# Patient Record
Sex: Female | Born: 1935 | Race: White | Hispanic: No | Smoking: Never smoker
Health system: Southern US, Community
[De-identification: ages and names within clinical notes are randomized; demographics above are authoritative.]

## PROBLEM LIST (undated history)

## (undated) DIAGNOSIS — N393 Stress incontinence (female) (male): Secondary | ICD-10-CM

## (undated) DIAGNOSIS — F329 Major depressive disorder, single episode, unspecified: Secondary | ICD-10-CM

## (undated) DIAGNOSIS — K219 Gastro-esophageal reflux disease without esophagitis: Secondary | ICD-10-CM

## (undated) DIAGNOSIS — D649 Anemia, unspecified: Secondary | ICD-10-CM

## (undated) DIAGNOSIS — E785 Hyperlipidemia, unspecified: Secondary | ICD-10-CM

## (undated) DIAGNOSIS — F32A Depression, unspecified: Secondary | ICD-10-CM

## (undated) DIAGNOSIS — M1990 Unspecified osteoarthritis, unspecified site: Secondary | ICD-10-CM

## (undated) HISTORY — PX: CHOLECYSTECTOMY: SHX55

## (undated) HISTORY — PX: OTHER SURGICAL HISTORY: SHX169

---

## 1999-09-06 ENCOUNTER — Encounter: Payer: Self-pay | Admitting: Internal Medicine

## 1999-09-06 ENCOUNTER — Ambulatory Visit (HOSPITAL_COMMUNITY): Admission: RE | Admit: 1999-09-06 | Discharge: 1999-09-06 | Payer: Self-pay | Admitting: Internal Medicine

## 2000-01-11 ENCOUNTER — Encounter: Payer: Self-pay | Admitting: Urology

## 2000-01-15 ENCOUNTER — Observation Stay (HOSPITAL_COMMUNITY): Admission: RE | Admit: 2000-01-15 | Discharge: 2000-01-16 | Payer: Self-pay | Admitting: Urology

## 2001-03-04 ENCOUNTER — Ambulatory Visit (HOSPITAL_COMMUNITY): Admission: RE | Admit: 2001-03-04 | Discharge: 2001-03-04 | Payer: Self-pay | Admitting: Internal Medicine

## 2001-03-04 ENCOUNTER — Encounter: Payer: Self-pay | Admitting: Internal Medicine

## 2002-02-02 ENCOUNTER — Other Ambulatory Visit: Admission: RE | Admit: 2002-02-02 | Discharge: 2002-02-02 | Payer: Self-pay | Admitting: Internal Medicine

## 2004-02-04 ENCOUNTER — Other Ambulatory Visit: Admission: RE | Admit: 2004-02-04 | Discharge: 2004-02-04 | Payer: Self-pay | Admitting: Internal Medicine

## 2004-02-28 ENCOUNTER — Encounter: Admission: RE | Admit: 2004-02-28 | Discharge: 2004-02-28 | Payer: Self-pay | Admitting: Internal Medicine

## 2004-04-27 ENCOUNTER — Ambulatory Visit (HOSPITAL_COMMUNITY): Admission: RE | Admit: 2004-04-27 | Discharge: 2004-04-27 | Payer: Self-pay | Admitting: Gastroenterology

## 2004-05-01 ENCOUNTER — Ambulatory Visit (HOSPITAL_COMMUNITY): Admission: RE | Admit: 2004-05-01 | Discharge: 2004-05-01 | Payer: Self-pay | Admitting: Cardiology

## 2004-05-16 ENCOUNTER — Ambulatory Visit (HOSPITAL_COMMUNITY): Admission: RE | Admit: 2004-05-16 | Discharge: 2004-05-16 | Payer: Self-pay | Admitting: Cardiology

## 2006-02-13 ENCOUNTER — Other Ambulatory Visit: Admission: RE | Admit: 2006-02-13 | Discharge: 2006-02-13 | Payer: Self-pay | Admitting: Internal Medicine

## 2007-02-08 ENCOUNTER — Inpatient Hospital Stay (HOSPITAL_COMMUNITY): Admission: EM | Admit: 2007-02-08 | Discharge: 2007-02-10 | Payer: Self-pay | Admitting: Emergency Medicine

## 2008-11-15 ENCOUNTER — Other Ambulatory Visit: Admission: RE | Admit: 2008-11-15 | Discharge: 2008-11-15 | Payer: Self-pay | Admitting: Geriatric Medicine

## 2010-04-26 ENCOUNTER — Encounter: Admission: RE | Admit: 2010-04-26 | Discharge: 2010-04-26 | Payer: Self-pay | Admitting: Geriatric Medicine

## 2011-01-30 NOTE — Discharge Summary (Signed)
Wong, Chloe              ACCOUNT NO.:  [id number]   MEDICAL RECORD NO.:  [id number]          PATIENT TYPE:  INP   LOCATION:  5038                         FACILITY:  [facility]   PHYSICIAN:  Kela Millin, M.D.DATE OF BIRTH:  24-Nov-1930   DATE OF ADMISSION:  02/08/2007  DATE OF DISCHARGE:                               DISCHARGE SUMMARY   DICTATION CANCELED      Kela Millin, M.D.     ACV/MEDQ  D:  02/10/2007  T:  02/10/2007  Job:  [id number]

## 2011-01-30 NOTE — H&P (Signed)
NAMEHARRYETTE, Wong              ACCOUNT NO.:  [id number]   MEDICAL RECORD NO.:  [id number]          PATIENT TYPE:  EMS   LOCATION:  MAJO                         FACILITY:  [facility]   PHYSICIAN:  Corinna L. Lendell Caprice, MDDATE OF BIRTH:  07/09/1931   DATE OF ADMISSION:  02/08/2007  DATE OF DISCHARGE:                              HISTORY & PHYSICAL   Chloe Wong is a 77 year old white female patient of Dr. Pete Glatter,  who presents with a 3-day history of vomiting.  She has been weak and  dizzy.  She lives alone.  She denies any dysuria.  She has chronic  urinary frequency and has to wear Depends.  Apparently an antiemetic was  called in for her but she continues to vomit.  She had been treated for  a sinus infection a week or two ago but this has improved.  She has no  diarrhea, no sick contacts, no recent travel, no abdominal pain.   PAST MEDICAL HISTORY:  1. Gastroesophageal reflux disease.  2. Anxiety.  3. Hyperlipidemia.   MEDICATIONS:  Citalopram, Prilosec.   No known drug allergies.   SOCIAL HISTORY:  The patient does not drink or smoke.   FAMILY HISTORY:  Noncontributory.   PAST SURGICAL HISTORY:  Cholecystectomy.   REVIEW OF SYSTEMS:  As above, otherwise negative.   PHYSICAL EXAMINATION:  VITAL SIGNS:  Temperature is 99.2, blood pressure  103/64, pulse 89, respiratory rate 20, oxygen saturation 95% on room  air.  GENERAL:  The patient is well-nourished, well-developed, in no acute  distress.  HEENT:  Normocephalic, atraumatic.  Pupils equal, round, and reactive to  light.  Sclerae are nonicteric.  Her mucous membranes are slightly dry.  NECK:  Supple.  No carotid bruits.  No thyromegaly.  LUNGS:  Clear to auscultation bilaterally without wheezes, rhonchi or  rales.  CARDIOVASCULAR:  Regular rate and rhythm without murmur, gallop, or rub.  ABDOMEN:  Normal bowel sounds, soft, nontender, nondistended.  GENITOURINARY, RECTAL:  Deferred.  EXTREMITIES:  No clubbing,  cyanosis, or edema.  SKIN:  No rash.  PSYCHIATRIC:  Normal affect.  NEUROLOGIC:  Alert and oriented.  Cranial nerves and sensorimotor exam  are intact.   LABORATORY DATA:  White blood cell count is 11.5 with 93% neutrophils,  4% lymphocytes, otherwise normal CBC.  Complete metabolic panel  significant for a potassium of 3.1, glucose 119, albumin 3.0, otherwise  unremarkable.  Urinalysis is turbid, rare squamous epithelial cells,  specific gravity 1.014, pH 5.5, small bilirubin, 15 ketones, moderate  blood, 100 protein, 4 urobilinogen, negative nitrite, large leukocyte  esterase, too numerous to count white cells, 3-6 red cells, few  bacteria.  Acute abdominal series to my reading shows a normal bowel gas  pattern and a negative chest x-ray.   ASSESSMENT AND PLAN:  1. Vomiting and dehydration.  The patient will be admitted for IV      fluids and supportive care.  2. Hyponatremia.  This will be repleted IV.  3. Urinary tract infection.  I will give Rocephin.  4. Gastroesophageal reflux disease.  She will get Protonix IV.  5. Anxiety.  She will get p.r.n. Ativan.  6. Hyperlipidemia.      Corinna L. Lendell Caprice, MD  Electronically Signed     CLS/MEDQ  D:  02/08/2007  T:  02/08/2007  Job:  [id number]   cc:   Hal T. Stoneking, M.D.

## 2011-02-02 NOTE — Cardiovascular Report (Signed)
NAME:  Chloe Wong, Chloe Wong                     ACCOUNT NO.:  [id number]   MEDICAL RECORD NO.:  [id number]                   PATIENT TYPE:  OIB   LOCATION:  2866                                 FACILITY:  [facility]   PHYSICIAN:  Armanda Magic, M.D.                  DATE OF BIRTH:  [birth year]   DATE OF PROCEDURE:  05/16/2004  DATE OF DISCHARGE:  05/16/2004                              CARDIAC CATHETERIZATION   REFERRING PHYSICIAN:  Darius Bump, M.D.   PROCEDURE:  Left heart catheterization, coronary angiography, left  ventriculography.   OPERATOR:  Armanda Magic, M.D.   INDICATIONS:  Chest pain, abnormal Cardiolite.   COMPLICATIONS:  None.   IV access via right femoral artery with 6 French sheath.   This is a 77 year old white female with a history of very vague chest pain  in the past and history of hypertension as well as reflux and also some  presyncope after being placed on atenolol.  She underwent stress Cardiolite  study, which showed inducible ischemia in the inferior wall versus  diaphragmatic attenuation.  She now presents for heart catheterization.   The patient was brought to the cardiac catheterization laboratory in the  fasting, nonsedated state.  Informed consent was obtained.  The patient was  connected to continuous heart rate and pulse oximetry monitoring and  intermittent blood pressure monitoring.  The right groin was prepped and  draped in a sterile fashion.  Xylocaine 1% was used for local anesthesia.  Using a modified Seldinger technique, a 6 French sheath was placed in the  right femoral artery.  Under fluoroscopic guidance a 6 [place] JL4 catheter  was placed in the left coronary artery.  Multiple cine films were taken in  30 degree RAO and 40 degree LAO views.  This catheter was exchanged out over  a guidewire for a 6 [place] JR4 catheter, which was placed under fluoroscopic  guidance in the right coronary artery.  Multiple cine films were taken in 30  degree RAO and 40 degree LAO views.  This catheter was then exchanged out  over a guidewire for a 6 French angled pigtail catheter, which was placed  under fluoroscopic guidance into the left ventricular cavity.  Left  ventriculography was performed in the 30 degree RAO view using a total of 30  mL of contrast at 30 cc/sec.  The catheter was then pulled back across the  aortic valve with no significant gradient.  At the end of the procedure all  catheters and sheaths were removed.  Manual compression was performed until  adequate hemostasis was obtained.  The patient was transferred back to her  room in stable condition.   RESULTS:  1.  The left main coronary artery is widely patent and bifurcates into the      left anterior descending and left circumflex artery.  2.  Left anterior descending artery gives rise to a very large diagonal  1,      which bifurcates into two daughter vessels, both of which are widely      patent.  The LAD then gives rise to a second diagonal branch, which is      widely patent.  The LAD is widely patent throughout its course to the      apex.  3.  The left circumflex is widely patent throughout its course and gives      rise to one obtuse marginal branch, which bifurcates into two daughter      branches, all of which are widely patent.  4.  The right coronary artery is widely patent throughout its course and      bifurcates distally into a posterior descending artery and      posterolateral artery, both of which are widely patent.  5.  Left ventriculography shows normal LV systolic function, EF 60%.  6.  Left ventricular pressure 117/6 mmHg, LVEDP 16 mmHg, aortic pressure      124/67 mmHg.   ASSESSMENT:  1.  Noncardiac chest pain.  2.  Normal coronary arteries.  3.  Normal left ventricular function.   PLAN:  Discharge to home after IV fluids and bed rest.  Groin check in two  weeks with me.                                               Armanda Magic,  M.D.    TT/MEDQ  D:  05/16/2004  T:  05/17/2004  Job:  [id number]

## 2011-02-02 NOTE — Op Note (Signed)
[facility]  Patient:    Chloe Wong, Chloe Wong                       MRN: [id number] Proc. Date: 01/15/00 Adm. Date:  [id number] Disc. Date: [id number] Attending:  Thermon Leyland CC:         Darius Bump, M.D.                           Operative Report  PREOPERATIVE DIAGNOSIS: 1. Grade 2 cystourethrocele. 2. Stress urinary incontinence.  POSTOPERATIVE DIAGNOSIS: 1. Grade 2 cystourethrocele. 2. Stress urinary incontinence.  OPERATION PERFORMED:  Anterior repair, pubovaginal sling, flexible cystoscopy and superpubic tube placement.  SURGEON:  Barron Alvine, M.D.  ANESTHESIA:  General.  INDICATIONS FOR PROCEDURE:  Ms. Deming is a 77 year old female.  She has had  longstanding complaints of significant stress incontinence.  She also has some moderate irritative voiding symptoms.  She has some problems with urinary urgency. Her biggest complaint, however, is stress incontinence which occurs with coughing, sneezing, laughing, lifting, etc.  She wears pads on a regular basis and this alters her quality of life.  On exam, she had evidence of urethral hypermobility with a positive Marshalls test and fairly significant objective stress incontinence.  The patient elected to proceed with pubovaginal sling and anterior repair.  She understands the advantages and disadvantages of this approach. She understands the risks potentially of worsening of her irritative voiding symptoms as well as the potential problem for urinary retention or incomplete bladder emptying and occasional need for additional surgery to correct problems.  Full informed consent was obtained.  DESCRIPTION OF PROCEDURE:  The patient was brought to the operating room where he had successful induction of general anesthesia.  She was placed in the midlithotomy position and prepped and draped in the usual manner.  A weighted vaginal speculum was utilized for exposure.  A Foley  catheter was placed and the bladder was drained.  The anterior vaginal mucosa was infiltrated with some Marcaine.  An incisoin was then made from midurethra back torwards the cervix.  The dissection was then performed between the vaginal wall and underlying bladder.  The tissue  showed normal planes of dissection.  The retropubic spaces were entered with fingertip blunt dissective technique and those tissues were found to be very attenuated and poor.  There were no obvious adhesions around the area of the bladder neck.  The entire cystocele was dissected out and freed.  We then performed a standard anterior repair utilizing four separate interrupted mattress sutures and 2-0 Vicryl suture to reapproximate the fascia and reduce the cystocele.  I then  re-established cardinal ligaments in the midline utilizing an additional 2-0 Vicryl suture.  Attention was then turned towards construction of the sling. Cadaveric fascia lata was chosen and a piece measuring approximately 2.5 x 10 cm was picked. This was anchored on both ends with #1 nylon suture.  A small incision was made in the superpubic area which was carried down to the level of the anterior fascia.  With direct digital finger control, a clamp was placed underneath the pubic symphysis and out the vaginal incision on the right side.  The nylon sutures were grabbed and brought out the superpubic incision.  This was done on the contralateral side as well.  The sling was then positioned right at the bladder  neck.  It was pexed in open position with some 3-0 Vicryl  suture.  Cystoscopy was then performed.  The sling appeared to be in good position right at the bladder  neck.  Blue dye could be seen from both orifices.  There was no evidence of bladder injury.  The suprapubic tube was placed with direct visual guidance right at the dome of the bladder.  Some anterior vaginal mucosa which was redundant was trimmed.  The vaginal  incision was then closed with a running 2-0 Vicryl suture.  The sling was purposely tied very loosely over three fingers.  The skin was reapproximated with surgical clips.  Some vaginal packing was applied.  The Foley catheter was  removed and the suprapubic tube was left to straight drainage.  The sponge and needle counts were correct.  Blood loss was minimal.  The patient appeared to tolerate the procedure well and she was brought to the recovery room in stable condition. DD:  01/15/00 TD:  01/17/00 Job: [id number]

## 2011-02-02 NOTE — Op Note (Signed)
[name], [name]                   ACCOUNT NO.:  [id number]   MEDICAL RECORD NO.:  [id number]                   PATIENT TYPE:  AMB   LOCATION:  ENDO                                 FACILITY:  [facility]   PHYSICIAN:  Danise Edge, M.D.                DATE OF BIRTH:  [birth year]   DATE OF PROCEDURE:  04/27/2004  DATE OF DISCHARGE:                                 OPERATIVE REPORT   PROCEDURE:  Screening colonoscopy.   PROCEDURE INDICATION:  Chloe Wong is a 77 year old female,  born 1930-12-08.  [name] is undergoing her first screening  colonoscopy with polypectomy to prevent colon cancer.   ENDOSCOPIST:  Danise Edge, M.D.   PREMEDICATION:  1. Versed 4 mg.  2. Demerol 50 mg.   DESCRIPTION OF PROCEDURE:  After obtaining informed consent, [name]  was placed in the left lateral decubitus position.  I administered  intravenous Demerol and intravenous Versed to achieve conscious sedation for  the procedure.  The patient's blood pressure, oxygen saturation, and cardiac  rhythm were monitored throughout the procedure and documented in the medical  record.   Anal inspection and digital rectal exam was normal.  The Olympus adjustable  pediatric colonoscope was introduced into the rectum and easily advanced to  the cecum.  Colonic preparation for the exam today was excellent.   RECTUM:  Normal.  SIGMOID COLON AND DESCENDING COLON:  Normal.  A few small diverticula are  present.  SPLENIC FLEXURE:  Normal.  TRANSVERSE COLON:  Normal.  HEPATIC FLEXURE:  Normal.  ASCENDING COLON:  Normal.  CECUM AND ILEOCECAL VALVE:  Normal.   ASSESSMENT:  Normal screening proctocolonoscopy to the cecum.  No endoscopic  evidence for the presence of colorectal neoplasia.                                               Danise Edge, M.D.    MJ/MEDQ  D:  04/27/2004  T:  04/27/2004  Job:  [id number]   cc:   Darius Bump, M.D.  [place] [place]  [place] [address]  Fax: [phone]

## 2013-06-11 ENCOUNTER — Other Ambulatory Visit: Payer: Self-pay | Admitting: Gastroenterology

## 2013-06-12 ENCOUNTER — Encounter (HOSPITAL_COMMUNITY): Payer: Self-pay | Admitting: *Deleted

## 2013-06-29 ENCOUNTER — Encounter (HOSPITAL_COMMUNITY): Payer: Self-pay | Admitting: Pharmacy Technician

## 2013-07-07 ENCOUNTER — Ambulatory Visit (HOSPITAL_COMMUNITY)
Admission: RE | Admit: 2013-07-07 | Discharge: 2013-07-07 | Disposition: A | Discharge status: Home / Self Care | Payer: Medicare Other | Source: Ambulatory Visit | Attending: Gastroenterology | Admitting: Gastroenterology

## 2013-07-07 ENCOUNTER — Encounter (HOSPITAL_COMMUNITY)
Admission: RE | Disposition: A | Discharge status: Home / Self Care | Payer: Self-pay | Source: Ambulatory Visit | Attending: Gastroenterology

## 2013-07-07 ENCOUNTER — Ambulatory Visit (HOSPITAL_COMMUNITY): Payer: Medicare Other | Admitting: *Deleted

## 2013-07-07 ENCOUNTER — Encounter (HOSPITAL_COMMUNITY): Payer: Self-pay | Admitting: *Deleted

## 2013-07-07 ENCOUNTER — Encounter (HOSPITAL_COMMUNITY): Payer: Medicare Other | Admitting: *Deleted

## 2013-07-07 DIAGNOSIS — K449 Diaphragmatic hernia without obstruction or gangrene: Secondary | ICD-10-CM | POA: Insufficient documentation

## 2013-07-07 DIAGNOSIS — K5730 Diverticulosis of large intestine without perforation or abscess without bleeding: Secondary | ICD-10-CM | POA: Insufficient documentation

## 2013-07-07 DIAGNOSIS — K219 Gastro-esophageal reflux disease without esophagitis: Secondary | ICD-10-CM | POA: Insufficient documentation

## 2013-07-07 DIAGNOSIS — K222 Esophageal obstruction: Secondary | ICD-10-CM | POA: Insufficient documentation

## 2013-07-07 DIAGNOSIS — E7800 Pure hypercholesterolemia, unspecified: Secondary | ICD-10-CM | POA: Insufficient documentation

## 2013-07-07 DIAGNOSIS — D509 Iron deficiency anemia, unspecified: Secondary | ICD-10-CM | POA: Insufficient documentation

## 2013-07-07 HISTORY — DX: Unspecified osteoarthritis, unspecified site: M19.90

## 2013-07-07 HISTORY — PX: ESOPHAGOGASTRODUODENOSCOPY (EGD) WITH PROPOFOL: SHX5813

## 2013-07-07 HISTORY — PX: COLONOSCOPY WITH PROPOFOL: SHX5780

## 2013-07-07 HISTORY — DX: Gastro-esophageal reflux disease without esophagitis: K21.9

## 2013-07-07 HISTORY — DX: Anemia, unspecified: D64.9

## 2013-07-07 HISTORY — DX: Stress incontinence (female) (male): N39.3

## 2013-07-07 HISTORY — DX: Hyperlipidemia, unspecified: E78.5

## 2013-07-07 SURGERY — COLONOSCOPY WITH PROPOFOL
Anesthesia: Monitor Anesthesia Care

## 2013-07-07 SURGERY — ESOPHAGOGASTRODUODENOSCOPY (EGD) WITH PROPOFOL
Anesthesia: Monitor Anesthesia Care

## 2013-07-07 MED ORDER — LACTATED RINGERS IV SOLN
INTRAVENOUS | Status: DC
  Administered 2013-07-07: 1000 mL via INTRAVENOUS

## 2013-07-07 MED ORDER — PROPOFOL 10 MG/ML IV BOLUS
INTRAVENOUS | Status: DC | PRN
  Administered 2013-07-07: 30 mg via INTRAVENOUS

## 2013-07-07 MED ORDER — KETAMINE HCL 10 MG/ML IJ SOLN
INTRAMUSCULAR | Status: DC | PRN
  Administered 2013-07-07: 25 mg via INTRAVENOUS

## 2013-07-07 MED ORDER — LABETALOL HCL 5 MG/ML IV SOLN
INTRAVENOUS | Status: DC | PRN
  Administered 2013-07-07 (×3): 5 mg via INTRAVENOUS

## 2013-07-07 MED ORDER — ONDANSETRON HCL 4 MG/2ML IJ SOLN
INTRAMUSCULAR | Status: DC | PRN
  Administered 2013-07-07: 4 mg via INTRAVENOUS

## 2013-07-07 MED ORDER — PROPOFOL INFUSION 10 MG/ML OPTIME
INTRAVENOUS | Status: DC | PRN
  Administered 2013-07-07: 100 ug/kg/min via INTRAVENOUS

## 2013-07-07 MED ORDER — MIDAZOLAM HCL 5 MG/5ML IJ SOLN
INTRAMUSCULAR | Status: DC | PRN
  Administered 2013-07-07 (×2): 1 mg via INTRAVENOUS

## 2013-07-07 SURGICAL SUPPLY — 25 items

## 2013-07-07 NOTE — Anesthesia Postprocedure Evaluation (Signed)
  Anesthesia Post-op Note  Patient: EDDYE BROXTERMAN Radde  Procedure(s) Performed: Procedure(s) (LRB): COLONOSCOPY WITH PROPOFOL (N/A) ESOPHAGOGASTRODUODENOSCOPY (EGD) WITH PROPOFOL (N/A)  Patient Location: PACU  Anesthesia Type: MAC  Level of Consciousness: awake and alert   Airway and Oxygen Therapy: Patient Spontanous Breathing  Post-op Pain: mild  Post-op Assessment: Post-op Vital signs reviewed, Patient's Cardiovascular Status Stable, Respiratory Function Stable, Patent Airway and No signs of Nausea or vomiting  Last Vitals:  Filed Vitals:   07/07/13 1420  BP: 129/61  Temp:   Resp: 13    Post-op Vital Signs: stable   Complications: No apparent anesthesia complications

## 2013-07-07 NOTE — H&P (Signed)
  Problem: Iron deficiency based on a low serum iron saturation in February 2013 and August 2014. Normal screening colonoscopy in August 2012.  History: The patient is an 79 year old female born May 31, 1931. The patient has unexplained iron deficiency. She denies hemoptysis, hematuria, or gastrointestinal bleeding. She does not take nonsteroidal anti-inflammatory medication. She has been experiencing epigastric discomfort with postprandial regurgitation.  The patient is scheduled to undergo diagnostic esophagogastroduodenoscopy and colonoscopy.  Past medical history: Hypercholesterolemia. Rosacea. Gastroesophageal reflux. Allergic rhinitis. Urinary incontinence. Osteopenia. Colonic diverticulosis. Benign familial tremor. Cholecystectomy. Bladder suspension surgery. Bilateral cataract surgery.  Medication allergies: Lipitor and simvastatin.  Exam: The patient is alert and lying comfortably on the endoscopy stretcher. Abdomen is soft and nontender to palpation. Cardiac exam reveals a regular rhythm. Lungs are clear to auscultation.  Plan . Proceed with diagnostic esophagogastroduodenoscopy and colonoscopy to evaluate unexplained iron deficiency.

## 2013-07-07 NOTE — Anesthesia Preprocedure Evaluation (Signed)
Anesthesia Evaluation  Patient identified by MRN, date of birth, ID band Patient awake    Reviewed: Allergy & Precautions, H&P , NPO status , Patient's Chart, lab work & pertinent test results  Airway Mallampati: II TM Distance: >3 FB Neck ROM: Full    Dental no notable dental hx.    Pulmonary neg pulmonary ROS,  breath sounds clear to auscultation  Pulmonary exam normal       Cardiovascular negative cardio ROS  Rhythm:Regular Rate:Normal     Neuro/Psych negative neurological ROS  negative psych ROS   GI/Hepatic negative GI ROS, Neg liver ROS,   Endo/Other  negative endocrine ROS  Renal/GU negative Renal ROS  negative genitourinary   Musculoskeletal negative musculoskeletal ROS (+)   Abdominal   Peds negative pediatric ROS (+)  Hematology negative hematology ROS (+)   Anesthesia Other Findings   Reproductive/Obstetrics negative OB ROS                           Anesthesia Physical Anesthesia Plan  ASA: II  Anesthesia Plan: MAC   Post-op Pain Management:    Induction:   Airway Management Planned:   Additional Equipment:   Intra-op Plan:   Post-operative Plan:   Informed Consent: I have reviewed the patients History and Physical, chart, labs and discussed the procedure including the risks, benefits and alternatives for the proposed anesthesia with the patient or authorized representative who has indicated his/her understanding and acceptance.   Dental advisory given  Plan Discussed with:   Anesthesia Plan Comments:         Anesthesia Quick Evaluation

## 2013-07-07 NOTE — Preoperative (Signed)
Beta Blockers   Reason not to administer Beta Blockers:Not Applicable, not on home BB 

## 2013-07-07 NOTE — Transfer of Care (Signed)
Immediate Anesthesia Transfer of Care Note  Patient: Chloe Wong  Procedure(s) Performed: Procedure(s): COLONOSCOPY WITH PROPOFOL (N/A) ESOPHAGOGASTRODUODENOSCOPY (EGD) WITH PROPOFOL (N/A)  Patient Location: PACU  Anesthesia Type:MAC  Level of Consciousness: Patient easily awoken, sedated, comfortable, cooperative, following commands, responds to stimulation.   Airway & Oxygen Therapy: Patient spontaneously breathing, ventilating well, oxygen via simple oxygen mask.  Post-op Assessment: Report given to PACU RN, vital signs reviewed and stable, moving all extremities.   Post vital signs: Reviewed and stable.  Complications: No apparent anesthesia complications

## 2013-07-07 NOTE — Op Note (Signed)
Problem: Unexplained iron deficiency  Endoscopist: Danise Edge  Premedication: Propofol administered by anesthesia  Procedure: Diagnostic esophagogastroduodenoscopy The patient was placed in the left lateral decubitus position. The Pentax gastroscope was passed through the posterior hypopharynx into the proximal esophagus without difficulty. The hypopharynx, larynx, and vocal cords appeared normal.  Esophagoscopy: The proximal, mid, and lower segments of the esophageal mucosa appear normal. The squamocolumnar junction is noted at 30 cm from the incisor teeth. There is a nonobstructing Schatzki's ring at the esophagogastric junction.  Gastroscopy: There is a large hiatal hernia. Retroflexed view of the gastric cardia and fundus is normal. The gastric body, antrum, and pylorus appeared normal.  Duodenoscopy: The duodenal bulb and descending duodenum appeared normal.  Assessment: Nonobstructing Schatzki's ring at the esophagogastric junction. Large hiatal hernia. Otherwise normal esophagogastroduodenoscopy  Procedure: Diagnostic colonoscopy Anal inspection and digital rectal exam were normal. The Pentax pediatric colonoscope was introduced into the rectum and advanced to the cecum. A normal-appearing ileocecal valve and appendiceal orifice were identified. Colonic preparation for the exam today was good.  Rectum. Normal. Retroflex view of the distal rectum normal.  Sigmoid colon and descending colon. Left colonic diverticulosis  Splenic flexure. Normal  Transverse colon. Normal  Hepatic flexure. Normal  Ascending colon. Normal  Cecum and ileocecal valve. Normal  Assessment: Normal diagnostic proctocolonoscopy to the cecum. Left colonic diverticulosis.

## 2013-07-08 ENCOUNTER — Encounter (HOSPITAL_COMMUNITY): Payer: Self-pay | Admitting: Gastroenterology

## 2013-10-14 ENCOUNTER — Inpatient Hospital Stay (HOSPITAL_COMMUNITY)
Admission: EM | Admit: 2013-10-14 | Discharge: 2013-10-16 | DRG: 689 | Disposition: A | Discharge status: Home Health | Payer: Medicare Other | Attending: Internal Medicine | Admitting: Internal Medicine

## 2013-10-14 ENCOUNTER — Encounter (HOSPITAL_COMMUNITY): Payer: Self-pay | Admitting: Emergency Medicine

## 2013-10-14 ENCOUNTER — Emergency Department (HOSPITAL_COMMUNITY): Payer: Medicare Other

## 2013-10-14 DIAGNOSIS — R4701 Aphasia: Secondary | ICD-10-CM | POA: Diagnosis present

## 2013-10-14 DIAGNOSIS — R4182 Altered mental status, unspecified: Secondary | ICD-10-CM

## 2013-10-14 DIAGNOSIS — M129 Arthropathy, unspecified: Secondary | ICD-10-CM | POA: Diagnosis present

## 2013-10-14 DIAGNOSIS — E785 Hyperlipidemia, unspecified: Secondary | ICD-10-CM | POA: Diagnosis present

## 2013-10-14 DIAGNOSIS — N390 Urinary tract infection, site not specified: Principal | ICD-10-CM

## 2013-10-14 DIAGNOSIS — G9340 Encephalopathy, unspecified: Secondary | ICD-10-CM

## 2013-10-14 DIAGNOSIS — K219 Gastro-esophageal reflux disease without esophagitis: Secondary | ICD-10-CM | POA: Diagnosis present

## 2013-10-14 DIAGNOSIS — I10 Essential (primary) hypertension: Secondary | ICD-10-CM

## 2013-10-14 DIAGNOSIS — Z87440 Personal history of urinary (tract) infections: Secondary | ICD-10-CM

## 2013-10-14 HISTORY — DX: Major depressive disorder, single episode, unspecified: F32.9

## 2013-10-14 HISTORY — DX: Depression, unspecified: F32.A

## 2013-10-14 LAB — CBC
HEMATOCRIT: 37.2 % (ref 36.0–46.0)
Hemoglobin: 12.8 g/dL (ref 12.0–15.0)
MCH: 31.2 pg (ref 26.0–34.0)
MCHC: 34.4 g/dL (ref 30.0–36.0)
MCV: 90.7 fL (ref 78.0–100.0)
PLATELETS: 216 10*3/uL (ref 150–400)
RBC: 4.1 MIL/uL (ref 3.87–5.11)
RDW: 17.4 % — ABNORMAL HIGH (ref 11.5–15.5)
WBC: 11.2 10*3/uL — ABNORMAL HIGH (ref 4.0–10.5)

## 2013-10-14 LAB — CBC WITH DIFFERENTIAL/PLATELET
BASOS PCT: 0 % (ref 0–1)
Basophils Absolute: 0 10*3/uL (ref 0.0–0.1)
Eosinophils Absolute: 0.1 10*3/uL (ref 0.0–0.7)
Eosinophils Relative: 1 % (ref 0–5)
HEMATOCRIT: 40.4 % (ref 36.0–46.0)
Hemoglobin: 13.7 g/dL (ref 12.0–15.0)
LYMPHS PCT: 29 % (ref 12–46)
Lymphs Abs: 2.7 10*3/uL (ref 0.7–4.0)
MCH: 30.9 pg (ref 26.0–34.0)
MCHC: 33.9 g/dL (ref 30.0–36.0)
MCV: 91 fL (ref 78.0–100.0)
Monocytes Absolute: 0.7 10*3/uL (ref 0.1–1.0)
Monocytes Relative: 8 % (ref 3–12)
NEUTROS ABS: 5.8 10*3/uL (ref 1.7–7.7)
NEUTROS PCT: 62 % (ref 43–77)
Platelets: 258 10*3/uL (ref 150–400)
RBC: 4.44 MIL/uL (ref 3.87–5.11)
RDW: 17.4 % — AB (ref 11.5–15.5)
WBC: 9.3 10*3/uL (ref 4.0–10.5)

## 2013-10-14 LAB — COMPREHENSIVE METABOLIC PANEL
ALBUMIN: 3.6 g/dL (ref 3.5–5.2)
ALK PHOS: 62 U/L (ref 39–117)
ALT: 10 U/L (ref 0–35)
AST: 21 U/L (ref 0–37)
BILIRUBIN TOTAL: 0.3 mg/dL (ref 0.3–1.2)
BUN: 16 mg/dL (ref 6–23)
CO2: 21 meq/L (ref 19–32)
Calcium: 9.7 mg/dL (ref 8.4–10.5)
Chloride: 100 mEq/L (ref 96–112)
Creatinine, Ser: 1.01 mg/dL (ref 0.50–1.10)
GFR calc Af Amer: 59 mL/min — ABNORMAL LOW (ref >90)
GFR, EST NON AFRICAN AMERICAN: 51 mL/min — AB (ref >90)
Glucose, Bld: 88 mg/dL (ref 70–99)
POTASSIUM: 3.9 meq/L (ref 3.7–5.3)
Sodium: 138 mEq/L (ref 137–147)
Total Protein: 6.9 g/dL (ref 6.0–8.3)

## 2013-10-14 LAB — URINALYSIS W MICROSCOPIC + REFLEX CULTURE
BILIRUBIN URINE: NEGATIVE
GLUCOSE, UA: NEGATIVE mg/dL
Hgb urine dipstick: NEGATIVE
Ketones, ur: 15 mg/dL — AB
Nitrite: POSITIVE — AB
PROTEIN: NEGATIVE mg/dL
Specific Gravity, Urine: 1.018 (ref 1.005–1.030)
UROBILINOGEN UA: 0.2 mg/dL (ref 0.0–1.0)
pH: 8.5 — ABNORMAL HIGH (ref 5.0–8.0)

## 2013-10-14 LAB — CREATININE, SERUM
Creatinine, Ser: 1.04 mg/dL (ref 0.50–1.10)
GFR, EST AFRICAN AMERICAN: 57 mL/min — AB (ref >90)
GFR, EST NON AFRICAN AMERICAN: 49 mL/min — AB (ref >90)

## 2013-10-14 LAB — POCT I-STAT TROPONIN I: TROPONIN I, POC: 0.01 ng/mL (ref 0.00–0.08)

## 2013-10-14 MED ORDER — HALOPERIDOL LACTATE 5 MG/ML IJ SOLN
2.5000 mg | Freq: Once | INTRAMUSCULAR | Status: AC
  Administered 2013-10-15: 2.5 mg via INTRAVENOUS
  Filled 2013-10-14: qty 1

## 2013-10-14 MED ORDER — SODIUM CHLORIDE 0.9 % IV SOLN
INTRAVENOUS | Status: DC
  Administered 2013-10-15: 01:00:00 via INTRAVENOUS

## 2013-10-14 MED ORDER — ENOXAPARIN SODIUM 40 MG/0.4ML ~~LOC~~ SOLN
40.0000 mg | SUBCUTANEOUS | Status: DC
  Administered 2013-10-14 – 2013-10-15 (×2): 40 mg via SUBCUTANEOUS
  Filled 2013-10-14 (×3): qty 0.4

## 2013-10-14 MED ORDER — CITALOPRAM HYDROBROMIDE 20 MG PO TABS
20.0000 mg | ORAL_TABLET | Freq: Every evening | ORAL | Status: DC
  Administered 2013-10-15: 20 mg via ORAL
  Filled 2013-10-14 (×3): qty 1

## 2013-10-14 MED ORDER — ONDANSETRON HCL 4 MG/2ML IJ SOLN: 4.0000 mg | Freq: Four times a day (QID) | INTRAMUSCULAR | Status: DC | PRN

## 2013-10-14 MED ORDER — ASPIRIN EC 81 MG PO TBEC
81.0000 mg | DELAYED_RELEASE_TABLET | Freq: Every day | ORAL | Status: DC
  Administered 2013-10-14 – 2013-10-16 (×3): 81 mg via ORAL
  Filled 2013-10-14 (×3): qty 1

## 2013-10-14 MED ORDER — LORAZEPAM 2 MG/ML IJ SOLN
0.5000 mg | Freq: Once | INTRAMUSCULAR | Status: AC
  Administered 2013-10-14: 0.5 mg via INTRAVENOUS
  Filled 2013-10-14: qty 1

## 2013-10-14 MED ORDER — ACETAMINOPHEN 325 MG PO TABS: 650.0000 mg | ORAL_TABLET | Freq: Four times a day (QID) | ORAL | Status: DC | PRN

## 2013-10-14 MED ORDER — DEXTROSE 5 % IV SOLN
1.0000 g | Freq: Once | INTRAVENOUS | Status: AC
  Administered 2013-10-14: 1 g via INTRAVENOUS
  Filled 2013-10-14: qty 10

## 2013-10-14 MED ORDER — ACETAMINOPHEN 650 MG RE SUPP
650.0000 mg | Freq: Once | RECTAL | Status: AC
  Administered 2013-10-14: 650 mg via RECTAL
  Filled 2013-10-14: qty 1

## 2013-10-14 MED ORDER — ACETAMINOPHEN 650 MG RE SUPP: 650.0000 mg | Freq: Four times a day (QID) | RECTAL | Status: DC | PRN

## 2013-10-14 MED ORDER — HALOPERIDOL 0.5 MG PO TABS
0.5000 mg | ORAL_TABLET | Freq: Three times a day (TID) | ORAL | Status: DC | PRN
  Filled 2013-10-14: qty 1

## 2013-10-14 MED ORDER — DEXTROSE 5 % IV SOLN
1.0000 g | INTRAVENOUS | Status: DC
  Filled 2013-10-14: qty 10

## 2013-10-14 MED ORDER — ONDANSETRON HCL 4 MG/2ML IJ SOLN
4.0000 mg | Freq: Once | INTRAMUSCULAR | Status: AC
  Administered 2013-10-14: 4 mg via INTRAVENOUS
  Filled 2013-10-14: qty 2

## 2013-10-14 MED ORDER — DEXTROSE 5 % IV SOLN
1.0000 g | INTRAVENOUS | Status: DC
  Administered 2013-10-15: 1 g via INTRAVENOUS
  Filled 2013-10-14 (×3): qty 10

## 2013-10-14 MED ORDER — PANTOPRAZOLE SODIUM 40 MG PO TBEC
40.0000 mg | DELAYED_RELEASE_TABLET | Freq: Every day | ORAL | Status: DC
  Administered 2013-10-15 – 2013-10-16 (×2): 40 mg via ORAL
  Filled 2013-10-14 (×2): qty 1

## 2013-10-14 MED ORDER — SODIUM CHLORIDE 0.9 % IJ SOLN
3.0000 mL | Freq: Two times a day (BID) | INTRAMUSCULAR | Status: DC
  Administered 2013-10-14 – 2013-10-16 (×3): 3 mL via INTRAVENOUS

## 2013-10-14 MED ORDER — ONDANSETRON HCL 4 MG PO TABS
4.0000 mg | ORAL_TABLET | Freq: Four times a day (QID) | ORAL | Status: DC | PRN
Start: 2013-10-14 — End: 2013-10-16

## 2013-10-14 MED ORDER — SODIUM CHLORIDE 0.9 % IV SOLN: INTRAVENOUS | Status: DC

## 2013-10-14 MED ORDER — ACETAMINOPHEN 325 MG PO TABS
650.0000 mg | ORAL_TABLET | Freq: Once | ORAL | Status: DC
  Filled 2013-10-14: qty 2

## 2013-10-14 NOTE — ED Notes (Signed)
Per EMS pt came from [facility] where she was LSN last night at 2200 and when son came to see her today he noticed she was aphasic. Pt is orientedx3 she is disoriented to situation. No weakness, no facial droop, no drift with EMS. Pt has hx of UTI, no hx of stroke.

## 2013-10-14 NOTE — ED Provider Notes (Signed)
CSN: [id number]     Arrival date & time 10/14/13  1311 History   First MD Initiated Contact with Patient 10/14/13 1318     Chief Complaint  Patient presents with  . Aphasia   (Consider location/radiation/quality/duration/timing/severity/associated sxs/prior Treatment) HPI Chloe Wong is a 80 y.o. female who presents to emergency department from an assisted-living facility with possible aphasia. According to EMS report, patient was lasting normal yesterday. This morning patient's son visited her at that assisted-living facility and noted that her speech wasn't as clear as normal. EMS was called and patient was transferred here. Patient states that she has not noticed any changes in her speech, she denies any other neurological complaints. She denies any headache, head trauma, visual changes, problems speaking, weakness or numbness in extremities. Patient denies any recent illnesses. He didn't she denies any falls. She denies any history of heart problems or strokes. 2:25 PM Son is here. Reports that pt called his thsi morning confused. Apparently pt had an apt this morning, but when he asked if she went there, she could not recall. She was not sure if she went or not, and was not sure which doctor or what time apt was at. She does not have history of confusion. She also had slurred speech. He states symptoms are currently still there.     Past Medical History  Diagnosis Date  . Urine, incontinence, stress female   . GERD (gastroesophageal reflux disease)   . Arthritis     right leg  . Hyperlipidemia   . Anemia    Past Surgical History  Procedure Laterality Date  . Bladder lift  9 yrs ago  . Cholecystectomy  20 yrs ago  . Colonoscopy with propofol N/A 07/07/2013    Procedure: COLONOSCOPY WITH PROPOFOL;  Surgeon: Charolett BumpersMartin K Johnson, MD;  Location: [facility];  Service: Endoscopy;  Laterality: N/A;  . Esophagogastroduodenoscopy (egd) with propofol N/A 07/07/2013    Procedure:  ESOPHAGOGASTRODUODENOSCOPY (EGD) WITH PROPOFOL;  Surgeon: Charolett BumpersMartin K Johnson, MD;  Location: [facility];  Service: Endoscopy;  Laterality: N/A;   No family history on file. History  Substance Use Topics  . Smoking status: Never Smoker   . Smokeless tobacco: Never Used  . Alcohol Use: No   OB History   Grav Para Term Preterm Abortions TAB SAB Ect Mult Living                 Review of Systems  Constitutional: Negative for fever and chills.  Respiratory: Negative for cough, chest tightness and shortness of breath.   Cardiovascular: Negative for chest pain, palpitations and leg swelling.  Gastrointestinal: Negative for nausea, vomiting, abdominal pain and diarrhea.  Genitourinary: Negative for dysuria, flank pain, vaginal bleeding, vaginal discharge, vaginal pain and pelvic pain.  Musculoskeletal: Negative for arthralgias, myalgias, neck pain and neck stiffness.  Skin: Negative for rash.  Neurological: Positive for speech difficulty. Negative for dizziness, weakness, numbness and headaches.  All other systems reviewed and are negative.    Allergies  Review of patient's allergies indicates no known allergies.  Home Medications   Current Outpatient Rx  Name  Route  Sig  Dispense  Refill  . Calcium Carbonate-Vitamin D (CALCIUM 600 + D PO)   Oral   Take 1 tablet by mouth daily.         . citalopram (CELEXA) 40 MG tablet   Oral   Take 20 mg by mouth every evening.         . Ferrous  Sulfate (IRON) 28 MG TABS   Oral   Take 28 mg by mouth daily.         Marland Kitchen ibuprofen (ADVIL,MOTRIN) 200 MG tablet   Oral   Take 400 mg by mouth every 6 (six) hours as needed for pain.         . Multiple Vitamin (MULTIVITAMIN WITH MINERALS) TABS tablet   Oral   Take 1 tablet by mouth daily.         . naproxen sodium (ANAPROX) 220 MG tablet   Oral   Take 220 mg by mouth 2 (two) times daily as needed (pain).         Marland Kitchen omeprazole (PRILOSEC) 20 MG capsule   Oral   Take 20 mg by mouth  daily.         Marland Kitchen oxybutynin (DITROPAN-XL) 10 MG 24 hr tablet   Oral   Take 10 mg by mouth daily.          BP 119/64  Temp(Src) 98.7 F (37.1 C) (Oral)  Resp 24  SpO2 100% Physical Exam  Nursing note and vitals reviewed. Constitutional: She appears well-developed and well-nourished. No distress.  HENT:  Head: Normocephalic.  Eyes: Conjunctivae are normal.  Neck: Neck supple.  Cardiovascular: Normal rate, regular rhythm and normal heart sounds.   Pulmonary/Chest: Effort normal and breath sounds normal. No respiratory distress. She has no wheezes. She has no rales.  Abdominal: Soft. Bowel sounds are normal. She exhibits no distension. There is no tenderness. There is no rebound.  Musculoskeletal: She exhibits no edema.  Neurological: She is alert. No cranial nerve deficit. Coordination normal.  5/5 and equal upper and lower extremity strength bilaterally. Equal grip strength bilaterally. Normal finger to nose and heel to shin. No pronator drift. Pt is shaky, tremor at rest and intentional. Oriented to self only  Skin: Skin is warm and dry.  Psychiatric: She has a normal mood and affect. Her behavior is normal.    ED Course  Procedures (including critical care time) Labs Review Labs Reviewed  CBC WITH DIFFERENTIAL - Abnormal; Notable for the following:    RDW 17.4 (*)    All other components within normal limits  COMPREHENSIVE METABOLIC PANEL - Abnormal; Notable for the following:    GFR calc non Af Amer 51 (*)    GFR calc Af Amer 59 (*)    All other components within normal limits  URINALYSIS W MICROSCOPIC + REFLEX CULTURE - Abnormal; Notable for the following:    APPearance CLOUDY (*)    pH 8.5 (*)    Ketones, ur 15 (*)    Nitrite POSITIVE (*)    Leukocytes, UA MODERATE (*)    Bacteria, UA MANY (*)    All other components within normal limits  URINE CULTURE  POCT I-STAT TROPONIN I   Imaging Review Dg Chest 2 View  10/14/2013   CLINICAL DATA:  80 year old female  with aphasia. Initial encounter.  EXAM: CHEST  2 VIEW  COMPARISON:  None.  FINDINGS: Moderate-sized retrocardiac gastric hiatal hernia. Normal cardiac size and mediastinal contours. No pneumothorax, pulmonary edema, pleural effusion or confluent pulmonary opacity. No acute osseous abnormality identified.  IMPRESSION: No acute cardiopulmonary abnormality.  Moderate hiatal hernia.   Electronically Signed   By: Augusto Gamble M.D.   On: 10/14/2013 14:36   Ct Head Wo Contrast  10/14/2013   CLINICAL DATA:  Aphasia  EXAM: CT HEAD WITHOUT CONTRAST  TECHNIQUE: Contiguous axial images were obtained from the base  of the skull through the vertex without intravenous contrast.  COMPARISON:  MRI 04/26/2010  FINDINGS: Generalized atrophy. Mild chronic microvascular ischemia in the white matter. Benign basal ganglia cyst on the right is unchanged.  Negative for acute infarct. Negative for hemorrhage or mass. No skull lesion identified.  IMPRESSION: No acute abnormality.  Mild chronic microvascular ischemic change.   Electronically Signed   By: Marlan Palau M.D.   On: 10/14/2013 14:56    EKG Interpretation    Date/Time:  Wednesday October 14 2013 13:20:37 EST Ventricular Rate:  83 PR Interval:  153 QRS Duration: 109 QT Interval:  417 QTC Calculation: 490 R Axis:   63 Text Interpretation:  Sinus rhythm RSR' in V1 or V2, probably normal variant Borderline prolonged QT interval Confirmed by Freida Busman  MD, ANTHONY ([id number]) on 10/14/2013 3:40:23 PM            MDM   1. Altered mental status   2. UTI (lower urinary tract infection)      Pt in ED with confusion, memory loss, aphasia. Symptoms continue. Last seen normal last night. Pt alert, oriented to self. No other neuro deficits. CT head, labs ordered.   4:12 PM PT has UTI, otherwise unremarkable labs. Pt did spike a fever of 100.8 Orally, tylenol ordered. HR and BP remains normal. Pt also became more agitated, i ordered her 0.5mg  of ativan. She has no neuro  deficits on exam. Differential still includes CVA, although doubt given no other neuro deficits. Another possibility is UTI.  I spoke with [facility]. Will admit for further treatment. Rocephin started. Pt was also discussed with Dr. Freida Busman, and he has seen pt.   Filed Vitals:   10/14/13 1332 10/14/13 1333 10/14/13 1334 10/14/13 1451  BP: 119/64   149/69  Pulse:    86  Temp: 98.7 F (37.1 C)   100.8 F (38.2 C)  TempSrc: Oral   Oral  Resp:  24  20  SpO2: 100%  100% 99%     Lottie Mussel, PA-C 10/14/13 1615

## 2013-10-14 NOTE — Progress Notes (Signed)
ANTIBIOTIC CONSULT NOTE - INITIAL  Pharmacy Consult for Rocephin Indication: UTI  No Known Allergies  Patient Measurements:    Vital Signs: Temp: 99.7 F (37.6 C) (01/28 1640) Temp src: Oral (01/28 1640) BP: 164/83 mmHg (01/28 1640) Pulse Rate: 96 (01/28 1640) Intake/Output from previous day:   Intake/Output from this shift:    Labs:  Recent Labs  10/14/13 1355  WBC 9.3  HGB 13.7  PLT 258  CREATININE 1.01   The CrCl is unknown because both a height and weight (above a minimum accepted value) are required for this calculation. No results found for this basename: VANCOTROUGH, VANCOPEAK, VANCORANDOM, GENTTROUGH, GENTPEAK, GENTRANDOM, TOBRATROUGH, TOBRAPEAK, TOBRARND, AMIKACINPEAK, AMIKACINTROU, AMIKACIN,  in the last 72 hours   Microbiology: No results found for this or any previous visit (from the past 720 hour(s)).  Medical History: Past Medical History  Diagnosis Date  . Urine, incontinence, stress female   . GERD (gastroesophageal reflux disease)   . Arthritis     right leg  . Hyperlipidemia   . Anemia    Assessment: 82 YOF from assisted living facility who presented with AMS likely d/t UTI. Pharmacy is consulted to dose rocephin. Temp = 100.8, wbc 9.3. Received rocephin 1g given at 1600. Urine culture is pending. No renal dose adjustment needed.  Goal of Therapy:  Elimination of infection.   Plan:  Rocephin 1g IV Q 24 hrs, next dose tomorrow afternoon. F/u cultures and clinical course  Bayard HuggerMei Kenyan Karnes, PharmD, BCPS  Clinical Pharmacist  Pager: [phone]   10/14/2013,6:43 PM

## 2013-10-14 NOTE — Progress Notes (Signed)
Chloe BundeSara A Y Wong [id number] Code Status: Full Admission Data: 10/14/2013 6:00 PM Attending Provider:  York SpanielBuriev PCP:No primary provider on file. Consults/ Treatment Team:    [name] is a 80 y.o. female patient admitted from ED awake, alert - oriented  X 3 - no acute distress noted.  VSS - Blood pressure 149/69, pulse 86, temperature 100.8 F (38.2 C), temperature source Oral, resp. rate 20, SpO2 99.00%.  no c/o shortness of breath, no c/o chest pain. Cardiac tele # 10, in place.  IV Fluids:  IV in place, occlusive dsg intact without redness, IV cath forearm left, condition patent and no redness normal saline.  Allergies:  No Known Allergies   Past Medical History  Diagnosis Date  . Urine, incontinence, stress female   . GERD (gastroesophageal reflux disease)   . Arthritis     right leg  . Hyperlipidemia   . Anemia    Medications Prior to Admission  Medication Sig Dispense Refill  . Calcium Carbonate-Vitamin D (CALCIUM 600 + D PO) Take 1 tablet by mouth daily.      . citalopram (CELEXA) 40 MG tablet Take 20 mg by mouth every evening.      . Ferrous Sulfate (IRON) 28 MG TABS Take 28 mg by mouth daily.      Marland Kitchen. ibuprofen (ADVIL,MOTRIN) 200 MG tablet Take 400 mg by mouth every 6 (six) hours as needed for pain.      . Multiple Vitamin (MULTIVITAMIN WITH MINERALS) TABS tablet Take 1 tablet by mouth daily.      . naproxen sodium (ANAPROX) 220 MG tablet Take 220 mg by mouth 2 (two) times daily as needed (pain).      Marland Kitchen. omeprazole (PRILOSEC) 20 MG capsule Take 20 mg by mouth daily.      Marland Kitchen. oxybutynin (DITROPAN-XL) 10 MG 24 hr tablet Take 10 mg by mouth daily.       History:  obtained from child. Tobacco/alcohol: denied none  Orientation to room, and floor completed with information packet given to patient/family.  Patient too confused to watch safety video at this time.  Admission INP armband ID verified with patient/family, and in place.   SR up x 2, fall assessment complete, with  patient and family able to verbalize understanding of risk associated with falls, and verbalized understanding to call nsg before up out of bed.  Call light within reach, patient able to voice, and demonstrate understanding.  Skin, clean-dry- intact without evidence of bruising, or skin tears.   No evidence of skin break down noted on exam.     Will cont to eval and treat per MD orders.  Al DecantFlores, Claudeen Leason F, [place]RN 10/14/2013 6:00 PM

## 2013-10-14 NOTE — H&P (Signed)
[facility] History and Physical  JOESPHINE SCHEMM Wildes WJX:[id number] DOB: [birth year] DOA: 10/14/2013  Referring physician:  PCP: No primary provider on file.  Specialists:   Chief Complaint: confusion   HPI: KENA LIMON is a 80 y.o. female with PMH of HTN, HPL, GERD presented with confusion, aphasia; patient was last seen normal yesterday. This morning patient's son visited her at that assisted-living facility and noted that her speech wasn't as clear as normal, she was confused, disoriented; she had h/o similar confusion with prior UTIs but no aphasia per her son'; no h/o ETOH use -ED: she found to have fever, and urine infection ; she denies SOB, no chest pain, no nausea, vomiting or diarrhea, no focal neuro weakness, no change in visions or hearing, no dysphasia    Review of Systems: The patient denies anorexia, fever, weight loss,, vision loss, decreased hearing, hoarseness, chest pain, syncope, dyspnea on exertion, peripheral edema, balance deficits, hemoptysis, abdominal pain, melena, hematochezia, severe indigestion/heartburn, hematuria, incontinence, genital sores, muscle weakness, suspicious skin lesions, transient blindness, difficulty walking, depression, unusual weight change, abnormal bleeding, enlarged lymph nodes, angioedema, and breast masses.    Past Medical History  Diagnosis Date  . Urine, incontinence, stress female   . GERD (gastroesophageal reflux disease)   . Arthritis     right leg  . Hyperlipidemia   . Anemia    Past Surgical History  Procedure Laterality Date  . Bladder lift  9 yrs ago  . Cholecystectomy  20 yrs ago  . Colonoscopy with propofol N/A 07/07/2013    Procedure: COLONOSCOPY WITH PROPOFOL;  Surgeon: Charolett Bumpers, MD;  Location: [facility];  Service: Endoscopy;  Laterality: N/A;  . Esophagogastroduodenoscopy (egd) with propofol N/A 07/07/2013    Procedure: ESOPHAGOGASTRODUODENOSCOPY (EGD) WITH PROPOFOL;  Surgeon: Charolett Bumpers, MD;   Location: [facility];  Service: Endoscopy;  Laterality: N/A;   Social History:  reports that she has never smoked. She has never used smokeless tobacco. She reports that she does not drink alcohol or use illicit drugs. AL;  where does patient live--home, ALF, SNF? and with whom if at home? Yes;  Can patient participate in ADLs?  No Known Allergies  No family history on file. no h/o CAD (be sure to complete)  Prior to Admission medications   Medication Sig Start Date End Date Taking? Authorizing Provider  Calcium Carbonate-Vitamin D (CALCIUM 600 + D PO) Take 1 tablet by mouth daily.    Historical Provider, MD  citalopram (CELEXA) 40 MG tablet Take 20 mg by mouth every evening.    Historical Provider, MD  Ferrous Sulfate (IRON) 28 MG TABS Take 28 mg by mouth daily.    Historical Provider, MD  ibuprofen (ADVIL,MOTRIN) 200 MG tablet Take 400 mg by mouth every 6 (six) hours as needed for pain.    Historical Provider, MD  Multiple Vitamin (MULTIVITAMIN WITH MINERALS) TABS tablet Take 1 tablet by mouth daily.    Historical Provider, MD  naproxen sodium (ANAPROX) 220 MG tablet Take 220 mg by mouth 2 (two) times daily as needed (pain).    Historical Provider, MD  omeprazole (PRILOSEC) 20 MG capsule Take 20 mg by mouth daily.    Historical Provider, MD  oxybutynin (DITROPAN-XL) 10 MG 24 hr tablet Take 10 mg by mouth daily.    Historical Provider, MD   Physical Exam: Filed Vitals:   10/14/13 1451  BP: 149/69  Pulse: 86  Temp: 100.8 F (38.2 C)  Resp: 20  General:  Alert, but confused  Eyes: eom-i, perrla   ENT: no orla ulcers   Neck: supple   Cardiovascular: s1,s2 rrr  Respiratory: CTA BL  Abdomen: soft, nt, nd   Skin: no rash  Musculoskeletal: no LE edema  Psychiatric: hallucinations   Neurologic: confused, aphasia, otherwise cn 2-12 intact; motor 5/5 bl  Labs on Admission:  Basic Metabolic Panel:  Recent Labs Lab 10/14/13 1355  NA 138  K 3.9  CL 100  CO2 21   GLUCOSE 88  BUN 16  CREATININE 1.01  CALCIUM 9.7   Liver Function Tests:  Recent Labs Lab 10/14/13 1355  AST 21  ALT 10  ALKPHOS 62  BILITOT 0.3  PROT 6.9  ALBUMIN 3.6   No results found for this basename: LIPASE, AMYLASE,  in the last 168 hours No results found for this basename: AMMONIA,  in the last 168 hours CBC:  Recent Labs Lab 10/14/13 1355  WBC 9.3  NEUTROABS 5.8  HGB 13.7  HCT 40.4  MCV 91.0  PLT 258   Cardiac Enzymes: No results found for this basename: CKTOTAL, CKMB, CKMBINDEX, TROPONINI,  in the last 168 hours  BNP (last 3 results) No results found for this basename: PROBNP,  in the last 8760 hours CBG: No results found for this basename: GLUCAP,  in the last 168 hours  Radiological Exams on Admission: Dg Chest 2 View  10/14/2013   CLINICAL DATA:  80 year old female with aphasia. Initial encounter.  EXAM: CHEST  2 VIEW  COMPARISON:  None.  FINDINGS: Moderate-sized retrocardiac gastric hiatal hernia. Normal cardiac size and mediastinal contours. No pneumothorax, pulmonary edema, pleural effusion or confluent pulmonary opacity. No acute osseous abnormality identified.  IMPRESSION: No acute cardiopulmonary abnormality.  Moderate hiatal hernia.   Electronically Signed   By: Augusto GambleLee  Hall M.D.   On: 10/14/2013 14:36   Ct Head Wo Contrast  10/14/2013   CLINICAL DATA:  Aphasia  EXAM: CT HEAD WITHOUT CONTRAST  TECHNIQUE: Contiguous axial images were obtained from the base of the skull through the vertex without intravenous contrast.  COMPARISON:  MRI 04/26/2010  FINDINGS: Generalized atrophy. Mild chronic microvascular ischemia in the white matter. Benign basal ganglia cyst on the right is unchanged.  Negative for acute infarct. Negative for hemorrhage or mass. No skull lesion identified.  IMPRESSION: No acute abnormality.  Mild chronic microvascular ischemic change.   Electronically Signed   By: Marlan Palauharles  Clark M.D.   On: 10/14/2013 14:56    EKG: Independently  reviewed. Nsr, no acute st changes   Assessment/Plan Principal Problem:   UTI (lower urinary tract infection) Active Problems:   Acute encephalopathy   HTN (hypertension)  80 y.o. female with PMH of HTN, HPL, GERD presented with confusion, aphasia   1. Acute encephalopathy probable related to UTI; r/o CVA; exam: still delirious; CT: No acute findings;  -treat UTI, obtain head MRI r/o CVA; neuro checks, bedside sitter; no benzo; prn haldol   2. UTI, started IV atx; pend c/s;   3. HTN, not on meds at home; monitor if uncontrolled will consider PO meds;    None;  if consultant consulted, please document name and whether formally or informally consulted  Code Status: full (must indicate code status--if unknown or must be presumed, indicate so) Family Communication: d/w patient (indicate person spoken with, if applicable, with phone number if by telephone) Disposition Plan: pend clinical improvement  (indicate anticipated LOS)  Time spent: >35 minutes   Delores Edelstein N [facility]  Pager [phone]  If 7PM-7AM, please contact night-coverage [email or web address] Password [facility] 10/14/2013, 4:15 PM

## 2013-10-14 NOTE — Progress Notes (Signed)
Called for report, RN to call back.

## 2013-10-14 NOTE — ED Notes (Signed)
Pt reports increasing nausea. No vomiting. Pt also becoming more restless and anxious. PA notified.

## 2013-10-14 NOTE — ED Provider Notes (Signed)
Medical screening examination/treatment/procedure(s) were conducted as a shared visit with non-physician practitioner(s) and myself.  I personally evaluated the patient during the encounter.  EKG Interpretation    Date/Time:  Wednesday October 14 2013 13:20:37 EST Ventricular Rate:  83 PR Interval:  153 QRS Duration: 109 QT Interval:  417 QTC Calculation: 490 R Axis:   63 Text Interpretation:  Sinus rhythm RSR' in V1 or V2, probably normal variant Borderline prolonged QT interval Confirmed by Freida BusmanALLEN  MD, Ebin Palazzi ([id number]) on 10/14/2013 3:40:23 PM           Patient here with confusion a negative head CT in urine that shows infection. Will start her on antibiotics. She will be admitted to medicine  Toy BakerAnthony T Lysandra Loughmiller, MD 10/14/13 1540

## 2013-10-15 ENCOUNTER — Inpatient Hospital Stay (HOSPITAL_COMMUNITY): Payer: Medicare Other

## 2013-10-15 LAB — BASIC METABOLIC PANEL
BUN: 16 mg/dL (ref 6–23)
CALCIUM: 8.3 mg/dL — AB (ref 8.4–10.5)
CO2: 22 mEq/L (ref 19–32)
Chloride: 107 mEq/L (ref 96–112)
Creatinine, Ser: 0.93 mg/dL (ref 0.50–1.10)
GFR, EST AFRICAN AMERICAN: 66 mL/min — AB (ref >90)
GFR, EST NON AFRICAN AMERICAN: 57 mL/min — AB (ref >90)
GLUCOSE: 92 mg/dL (ref 70–99)
Potassium: 4 mEq/L (ref 3.7–5.3)
SODIUM: 142 meq/L (ref 137–147)

## 2013-10-15 LAB — CBC
HCT: 33.9 % — ABNORMAL LOW (ref 36.0–46.0)
HEMOGLOBIN: 11.4 g/dL — AB (ref 12.0–15.0)
MCH: 30.8 pg (ref 26.0–34.0)
MCHC: 33.6 g/dL (ref 30.0–36.0)
MCV: 91.6 fL (ref 78.0–100.0)
Platelets: 202 10*3/uL (ref 150–400)
RBC: 3.7 MIL/uL — ABNORMAL LOW (ref 3.87–5.11)
RDW: 18 % — ABNORMAL HIGH (ref 11.5–15.5)
WBC: 10.1 10*3/uL (ref 4.0–10.5)

## 2013-10-15 MED ORDER — SENNOSIDES-DOCUSATE SODIUM 8.6-50 MG PO TABS
1.0000 | ORAL_TABLET | Freq: Every day | ORAL | Status: DC
  Administered 2013-10-15: 1 via ORAL
  Filled 2013-10-15: qty 1

## 2013-10-15 MED ORDER — IBUPROFEN 400 MG PO TABS
400.0000 mg | ORAL_TABLET | Freq: Four times a day (QID) | ORAL | Status: DC | PRN
  Filled 2013-10-15: qty 1

## 2013-10-15 MED ORDER — OXYBUTYNIN CHLORIDE ER 10 MG PO TB24
10.0000 mg | ORAL_TABLET | Freq: Every day | ORAL | Status: DC
  Administered 2013-10-15 – 2013-10-16 (×2): 10 mg via ORAL
  Filled 2013-10-15 (×2): qty 1

## 2013-10-15 NOTE — Progress Notes (Signed)
Pt.went to MRI ,but they were not able to do it bec.she could not lay still inspite of haldol given

## 2013-10-15 NOTE — Progress Notes (Signed)
Utilization review completed. Drisana Schweickert, RN, BSN. 

## 2013-10-15 NOTE — Progress Notes (Signed)
[facility] PROGRESS NOTE  Chloe Wong ZOX:[id number] DOB: [birth year] DOA: 10/14/2013 PCP: No primary provider on file.  Assessment/Plan:  1. Acute encephalopathy  Resolved after a difficult night. Probably related to UTI.  No deficits today. PT eval pending.   2. UTI Started on Rocephin Cultures pending   3. HTN Was elevated overnight.  Now within normal limits. Not on medications at home.    DVT Prophylaxis:  lovenox  Code Status: full Family Communication: daughter at bedside Disposition Plan: return to [facility] likely 1/30.  PT evaluation pending.    Antibiotics:  Rocephin  HPI/Subjective: Patient remembers being confused yesterday but nothing afterwards.  Feels well today.  Objective: Filed Vitals:   10/14/13 1620 10/14/13 1640 10/14/13 2208 10/15/13 0505  BP:  164/83 121/73 113/66  Pulse:  96 104 87  Temp: 100.8 F (38.2 C) 99.7 F (37.6 C) 98.8 F (37.1 C) 98.3 F (36.8 C)  TempSrc:  Oral Oral Oral  Resp:  20 20 18   Height:  5\' 3"  (1.6 m)    Weight:  71.3 kg (157 lb 3 oz)    SpO2:  89% 92% 92%    Intake/Output Summary (Last 24 hours) at 10/15/13 1137 Last data filed at 10/15/13 0700  Gross per 24 hour  Intake  862.5 ml  Output      2 ml  Net  860.5 ml   Filed Weights   10/14/13 1640  Weight: 71.3 kg (157 lb 3 oz)    Exam: General: Well developed, well nourished, NAD, appears stated age.  Has benign generalized tremor HEENT:  PERR, EOMI, Anicteic Sclera, MMM. No pharyngeal erythema or exudates  Neck: Supple, no JVD, no masses  Cardiovascular: RRR, S1 S2 auscultated, no rubs, murmurs or gallops.   Respiratory: Clear to auscultation bilaterally with equal chest rise  Abdomen: Soft, nontender, nondistended, + bowel sounds  Extremities: warm dry without cyanosis clubbing or edema.  Neuro: AAOx3, cranial nerves grossly intact. Strength 5/5 in upper and lower extremities  Skin: Without rashes exudates or  nodules.   Psych: Normal affect and demeanor with intact judgement and insight.  Very pleasant.    Data Reviewed: Basic Metabolic Panel:  Recent Labs Lab 10/14/13 1355 10/14/13 1830 10/15/13 0715  NA 138  --  142  K 3.9  --  4.0  CL 100  --  107  CO2 21  --  22  GLUCOSE 88  --  92  BUN 16  --  16  CREATININE 1.01 1.04 0.93  CALCIUM 9.7  --  8.3*   Liver Function Tests:  Recent Labs Lab 10/14/13 1355  AST 21  ALT 10  ALKPHOS 62  BILITOT 0.3  PROT 6.9  ALBUMIN 3.6     CBC:  Recent Labs Lab 10/14/13 1355 10/14/13 1830 10/15/13 0715  WBC 9.3 11.2* 10.1  NEUTROABS 5.8  --   --   HGB 13.7 12.8 11.4*  HCT 40.4 37.2 33.9*  MCV 91.0 90.7 91.6  PLT 258 216 202     Studies: Dg Chest 2 View  10/14/2013   CLINICAL DATA:  80 year old female with aphasia. Initial encounter.  EXAM: CHEST  2 VIEW  COMPARISON:  None.  FINDINGS: Moderate-sized retrocardiac gastric hiatal hernia. Normal cardiac size and mediastinal contours. No pneumothorax, pulmonary edema, pleural effusion or confluent pulmonary opacity. No acute osseous abnormality identified.  IMPRESSION: No acute cardiopulmonary abnormality.  Moderate hiatal hernia.   Electronically Signed   By: Augusto Gamble  M.D.   On: 10/14/2013 14:36   Ct Head Wo Contrast  10/14/2013   CLINICAL DATA:  Aphasia  EXAM: CT HEAD WITHOUT CONTRAST  TECHNIQUE: Contiguous axial images were obtained from the base of the skull through the vertex without intravenous contrast.  COMPARISON:  MRI 04/26/2010  FINDINGS: Generalized atrophy. Mild chronic microvascular ischemia in the white matter. Benign basal ganglia cyst on the right is unchanged.  Negative for acute infarct. Negative for hemorrhage or mass. No skull lesion identified.  IMPRESSION: No acute abnormality.  Mild chronic microvascular ischemic change.   Electronically Signed   By: Marlan Palauharles  Clark M.D.   On: 10/14/2013 14:56    Scheduled Meds: . aspirin EC  81 mg Oral Daily  . cefTRIAXone  (ROCEPHIN)  IV  1 g Intravenous Q24H  . citalopram  20 mg Oral QPM  . enoxaparin (LOVENOX) injection  40 mg Subcutaneous Q24H  . pantoprazole  40 mg Oral Daily  . senna-docusate  1 tablet Oral QHS  . sodium chloride  3 mL Intravenous Q12H   Continuous Infusions:   Principal Problem:   UTI (lower urinary tract infection) Active Problems:   Acute encephalopathy   HTN (hypertension)   Encephalopathy acute    Conley CanalYork, Marianne L, PA-C [facility] Pager [phone]. If 7PM-7AM, please contact night-coverage at [email or web address], password [email or web address] 10/15/2013, 11:37 AM  LOS: 1 day   Attending Patient seen and examined, agree with the above assessment and plan. Patient admitted with confusion-likely acute encephalopathy from UTI. Given antibiotics, mental status is rapidly improved and she is back to baseline. Suspect she does not need any further investigations. Continue IV antibiotics for one more day, await urine cultures, suspect back to independent living facility on 10/16/13.  Windell NorfolkS Katasha Riga MD

## 2013-10-15 NOTE — Evaluation (Signed)
Physical Therapy Evaluation Patient Details Name: Chloe Wong MRN: [id number] DOB: 10/03/1930 Today's Date: 10/15/2013 Time: [id number] PT Time Calculation (min): 16 min  PT Assessment / Plan / Recommendation History of Present Illness  Pt adm with acute encephalopathy due to UTI.  Clinical Impression  Pt presents to PT with unsteady gait and decr cognition in setting of UTI. Pt much improved over yesterday per son but not back to baseline.  Expect pt will continue to improve rapidly and will be able to return to [facility].  Will see again in AM to confirm pt's progress.    PT Assessment  Patient needs continued PT services    Follow Up Recommendations  No PT follow up (If pt back to baseline at DC.)    Does the patient have the potential to tolerate intense rehabilitation      Barriers to Discharge        Equipment Recommendations  None recommended by PT    Recommendations for Other Services     Frequency Min 3X/week    Precautions / Restrictions Precautions Precautions: Fall   Pertinent Vitals/Pain No c/o's      Mobility  Bed Mobility Overal bed mobility: Modified Independent General bed mobility comments: Tried getting out of bed on the side with rails up. Transfers Overall transfer level: Needs assistance Equipment used: None Transfers: Sit to/from Stand Sit to Stand: Supervision Ambulation/Gait Ambulation/Gait assistance: Min guard Ambulation Distance (Feet): 250 Feet Assistive device: None Gait Pattern/deviations: Step-through pattern;Decreased stride length General Gait Details: Pt with slightly unsteady gait and one stagger posteriorly. No overt loss of balance but not baseline per pt's son.    Exercises     PT Diagnosis: Difficulty walking  PT Problem List: Decreased balance;Decreased mobility PT Treatment Interventions: Gait training;Functional mobility training;Therapeutic activities;Balance training;Patient/family education     PT  Goals(Current goals can be found in the care plan section) Acute Rehab PT Goals Patient Stated Goal: return home PT Goal Formulation: With patient Time For Goal Achievement: 10/19/13 Potential to Achieve Goals: Good  Visit Information  Last PT Received On: 10/15/13 Assistance Needed: +1 History of Present Illness: Pt adm with acute encephalopathy due to UTI.       Prior Functioning  Home Living Family/patient expects to be discharged to:: Other (Comment) [facility] Living Arrangements: Alone Available Help at Discharge: Family;Available PRN/intermittently Type of Home: Apartment Home Access: Level entry Home Layout: One level Home Equipment: None Prior Function Level of Independence: Independent Comments: Drives Communication Communication: No difficulties    Cognition  Cognition Arousal/Alertness: Awake/alert Behavior During Therapy: WFL for tasks assessed/performed Overall Cognitive Status: Impaired/Different from baseline Area of Impairment: Problem solving;Awareness Awareness: Emergent Problem Solving: Slow processing;Requires verbal cues General Comments: Tried to get OOB on side with rails up. Wanted to make phone call all of a sudden mid treatment.    Extremity/Trunk Assessment Upper Extremity Assessment Upper Extremity Assessment: Overall WFL for tasks assessed Lower Extremity Assessment Lower Extremity Assessment: Overall WFL for tasks assessed   Balance Balance Overall balance assessment: Needs assistance Sitting-balance support: No upper extremity supported;Feet supported Sitting balance-Leahy Scale: Normal Standing balance support: No upper extremity supported Standing balance-Leahy Scale: Fair  End of Session PT - End of Session Activity Tolerance: Patient tolerated treatment well Patient left: in bed;with call bell/phone within reach;with nursing/sitter in room;with family/visitor present Nurse Communication: Mobility status   GP     [facility] Medical Healthwest-New OrleansMAYCOCK,Chloe Mcculloh 10/15/2013, 3:46 PM  [facility] Ananda Wong PT [phone]

## 2013-10-15 NOTE — Evaluation (Signed)
Clinical/Bedside Swallow Evaluation Patient Details  Name: Chloe Wong Walth MRN: [id number] Date of Birth: 10/18/1930  Today's Date: 10/15/2013 Time: 1040-1055 SLP Time Calculation (min): 15 min  Past Medical History:  Past Medical History  Diagnosis Date  . Urine, incontinence, stress female   . GERD (gastroesophageal reflux disease)   . Arthritis     right leg  . Hyperlipidemia   . Anemia   . Depression    Past Surgical History:  Past Surgical History  Procedure Laterality Date  . Bladder lift  9 yrs ago  . Cholecystectomy  20 yrs ago  . Colonoscopy with propofol N/A 07/07/2013    Procedure: COLONOSCOPY WITH PROPOFOL;  Surgeon: Charolett BumpersMartin K Johnson, MD;  Location: [facility];  Service: Endoscopy;  Laterality: N/A;  . Esophagogastroduodenoscopy (egd) with propofol N/A 07/07/2013    Procedure: ESOPHAGOGASTRODUODENOSCOPY (EGD) WITH PROPOFOL;  Surgeon: Charolett BumpersMartin K Johnson, MD;  Location: [facility];  Service: Endoscopy;  Laterality: N/A;   HPI:  Chloe Wong Ariola is a 80 Wong.o. female with PMH of HTN, HPL, GERD presented with confusion, aphasia; patient was last seen normal yesterday. This morning patient's son visited her at that assisted-living facility and noted that her speech wasn't as clear as normal, she was confused, disoriented; she had h/o similar confusion with prior UTIs but no aphasia per her son'; no h/o ETOH use   Assessment / Plan / Recommendation Clinical Impression  Pt demonstrates adequate swallow function. Only concern was gentle throat clear after consecutive straw sips. Pt expected to protect airway, but provided education regarding aspiration and precautions of smaller single sips to reduce risk. No SLP f/u needed, recommend Regualr diet and thin liquids.     Aspiration Risk  Mild    Diet Recommendation Regular;Thin liquid   Liquid Administration via: Cup;Straw Medication Administration: Whole meds with liquid Supervision: Patient able to self  feed Compensations: Small sips/bites Postural Changes and/or Swallow Maneuvers: Seated upright 90 degrees    Other  Recommendations Oral Care Recommendations: Oral care BID   Follow Up Recommendations  None    Frequency and Duration        Pertinent Vitals/Pain NA    SLP Swallow Goals     Swallow Study Prior Functional Status       General HPI: Chloe Wong Postiglione is a 80 Wong.o. female with PMH of HTN, HPL, GERD presented with confusion, aphasia; patient was last seen normal yesterday. This morning patient's son visited her at that assisted-living facility and noted that her speech wasn't as clear as normal, she was confused, disoriented; she had h/o similar confusion with prior UTIs but no aphasia per her son'; no h/o ETOH use Type of Study: Bedside swallow evaluation Diet Prior to this Study: NPO Temperature Spikes Noted: No Respiratory Status: Room air History of Recent Intubation: No Behavior/Cognition: Alert;Cooperative;Pleasant mood Oral Cavity - Dentition: Adequate natural dentition Self-Feeding Abilities: Able to feed self Patient Positioning: Upright in bed Baseline Vocal Quality: Clear Volitional Cough: Strong Volitional Swallow: Able to elicit    Oral/Motor/Sensory Function Overall Oral Motor/Sensory Function: Appears within functional limits for tasks assessed   Ice Chips     Thin Liquid Thin Liquid: Impaired Presentation: Cup;Straw Pharyngeal  Phase Impairments: Throat Clearing - Immediate    Nectar Thick Nectar Thick Liquid: Not tested   Honey Thick Honey Thick Liquid: Not tested   Puree Puree: Within functional limits   Solid   GO    Solid: Within functional limits  Harlon Ditty, MA CCC-SLP [phone]  Kwasi Joung, Riley Nearing 10/15/2013,10:59 AM

## 2013-10-16 LAB — URINE CULTURE

## 2013-10-16 MED ORDER — ASPIRIN 81 MG PO TBEC: 81.0000 mg | DELAYED_RELEASE_TABLET | Freq: Every day | ORAL | Status: DC

## 2013-10-16 MED ORDER — DOCUSATE SODIUM 100 MG PO CAPS: 100.0000 mg | ORAL_CAPSULE | Freq: Every day | ORAL | Status: DC

## 2013-10-16 MED ORDER — CEPHALEXIN 500 MG PO CAPS: 500.0000 mg | ORAL_CAPSULE | Freq: Two times a day (BID) | ORAL | Status: DC

## 2013-10-16 NOTE — Progress Notes (Signed)
Chloe Wong discharged Home, [facility] per MD order.  Discharge instructions reviewed and discussed with the patient and patient daughter, all questions and concerns answered. Copy of instructions and scripts given to patient.    Medication List    STOP taking these medications       naproxen sodium 220 MG tablet  Commonly known as:  ANAPROX      TAKE these medications       aspirin 81 MG EC tablet  Take 1 tablet (81 mg total) by mouth daily.     CALCIUM 600 + D PO  Take 1 tablet by mouth daily.     cephALEXin 500 MG capsule  Commonly known as:  KEFLEX  Take 1 capsule (500 mg total) by mouth 2 (two) times daily.     citalopram 40 MG tablet  Commonly known as:  CELEXA  Take 20 mg by mouth every evening.     docusate sodium 100 MG capsule  Commonly known as:  COLACE  Take 1 capsule (100 mg total) by mouth daily.     ibuprofen 200 MG tablet  Commonly known as:  ADVIL,MOTRIN  Take 400 mg by mouth every 6 (six) hours as needed for pain.     Iron 28 MG Tabs  Take 28 mg by mouth daily.     multivitamin with minerals Tabs tablet  Take 1 tablet by mouth daily.     omeprazole 20 MG capsule  Commonly known as:  PRILOSEC  Take 20 mg by mouth daily.     oxybutynin 10 MG 24 hr tablet  Commonly known as:  DITROPAN-XL  Take 10 mg by mouth daily.        Patients skin is clean, dry and intact, no evidence of skin break down. IV site discontinued and catheter remains intact. Site without signs and symptoms of complications. Dressing and pressure applied.  Patient escorted to car by volunteer in a wheelchair,  no distress noted upon discharge.  Chloe Wong, Chloe Wong 10/16/2013 12:13 PM

## 2013-10-16 NOTE — Care Management Note (Signed)
    Page 1 of 1   10/16/2013     11:18:10 AM   CARE MANAGEMENT NOTE 10/16/2013  Patient:  Chloe Wong,Chloe A Y   Account Number:  [id number]  Date Initiated:  10/16/2013  Documentation initiated by:  Letha CapeAYLOR, Grosser  Subjective/Objective Assessment:   dx uti,  admit- from [facility].     Action/Plan:   pt eval- rec hhpt   Anticipated DC Date:  10/16/2013   Anticipated DC Plan:  HOME W HOME HEALTH SERVICES      DC Planning Services  CM consult      [facility]   Choice offered to / List presented to:  C-1 Patient        HH arranged  HH-1 RN  HH-2 PT      [facility]  [facility]   Status of service:  Completed, signed off Medicare Important Message given?   (If response is "NO", the following Medicare IM given date fields will be blank) Date Medicare IM given:   Date Additional Medicare IM given:    Discharge Disposition:  HOME W HOME HEALTH SERVICES  Per UR Regulation:  Reviewed for med. necessity/level of care/duration of stay  If discussed at Long Length of Stay Meetings, dates discussed:    Comments:  10/16/13 11:15 Letha Capeeborah Jeena Arnett RN, BSN [phone] patient is from [facility], per physical therapy recs hhpt, [facility]hrn.  Soc will begin 24-48 hrs post discharge.

## 2013-10-16 NOTE — Progress Notes (Signed)
Physical Therapy Treatment Patient Details Name: Chloe Wong MRN: [id number] DOB: Nov 06, 1930 Today's Date: 10/16/2013 Time: 3254-9826 PT Time Calculation (min): 18 min  PT Assessment / Plan / Recommendation  History of Present Illness Pt adm with acute encephalopathy due to UTI.   PT Comments   Pt with improved mobility. Will have HHPT see to assess pt in home environment. Pt slightly unsteady but this may be baseline. Improved cognition but still with some deficits. May be baseline and pt has been compensating at home.    Follow Up Recommendations  Home health PT     Does the patient have the potential to tolerate intense rehabilitation     Barriers to Discharge        Equipment Recommendations  None recommended by PT    Recommendations for Other Services    Frequency     Progress towards PT Goals Progress towards PT goals: Goals met/education completed, patient discharged from PT  Plan Discharge plan needs to be updated    Precautions / Restrictions Precautions Precautions: Fall Restrictions Weight Bearing Restrictions: No   Pertinent Vitals/Pain N/A    Mobility  Bed Mobility Overal bed mobility: Modified Independent Transfers Overall transfer level: Modified independent Sit to Stand: Modified independent (Device/Increase time) Ambulation/Gait Ambulation/Gait assistance: Modified independent (Device/Increase time) Ambulation Distance (Feet): 300 Feet Assistive device: Straight cane;None Gait Pattern/deviations: Step-through pattern;Decreased stride length General Gait Details: No loss of balance and no staggering but slight unsteadiness.    Exercises     PT Diagnosis:    PT Problem List:   PT Treatment Interventions:     PT Goals (current goals can now be found in the care plan section)    Visit Information  Last PT Received On: 10/16/13 Assistance Needed: +1 History of Present Illness: Pt adm with acute encephalopathy due to UTI.    Subjective  Data      Cognition  Cognition Arousal/Alertness: Awake/alert Behavior During Therapy: WFL for tasks assessed/performed Memory: Decreased short-term memory General Comments: Mild cognitive deficits may be baseline.    Balance  Balance Sitting balance-Leahy Scale: Normal Standing balance support: No upper extremity supported Standing balance-Leahy Scale: Good High level balance activites: Turns;Direction changes;Head turns High Level Balance Comments: Slightly unsteady but no LOB.  End of Session PT - End of Session Activity Tolerance: Patient tolerated treatment well Patient left: in chair;with chair alarm set;with call bell/phone within reach Nurse Communication: Mobility status   GP     [facility] 10/16/2013, 11:12 AM  [facility] PT [phone]

## 2013-10-16 NOTE — Discharge Summary (Addendum)
Physician Discharge Summary  Chloe Wong HQI:[id number] DOB: June 29, 1931 DOA: 10/14/2013  PCP: Ginette Otto, MD  Admit date: 10/14/2013 Discharge date: 10/16/2013  Time spent: 50 minutes  Recommendations for Outpatient Follow-up:  Home Health RN and PT.  Needs physical therapy and medication management Patient has had altered mental status most likely from UTI, but concerned for underlying mild dementia as well. May need ALF soon. Advised patient not to drive until cleared by her primary care physician.   Discharge Diagnoses:  Principal Problem:   UTI (lower urinary tract infection) Active Problems:   Acute encephalopathy   HTN (hypertension)   Encephalopathy acute   Discharge Condition: stable.  Pleasantly confused.  Diet recommendation: Heart Healthy  Filed Weights   10/14/13 1640  Weight: 71.3 kg (157 lb 3 oz)    History of present illness:  Chloe Wong is a 80 y.o. female with PMH of HTN, HPL, GERD presented with confusion, aphasia; patient was last seen normal yesterday. This morning patient's son visited her at the independent living facility and noted that her speech wasn't as clear as normal, she was confused, disoriented; she had h/o similar confusion with prior UTIs but no aphasia per her son'; no h/o ETOH use   Hospital Course:  1. Acute encephalopathy  Resolved after a difficult night. Back to baseline. Probably related to UTI. No focal deficits, but has short term memory loss. CT head was negative for acute changes, but showed chronic microvascular disease. PT eval recommend home health PT.  I will add a Home Health RN for medication management. I believe the patient may have early dementia and may need an assisted living facility in the near future.   2. UTI  Started on Rocephin at admission Cultures grew ecoli sensitive to ancef. Patient will be discharged on 3 more days of Keflex 500 mg bid.  3. HTN  Was elevated overnight. Now within  normal limits.  Not on medications at home   Procedures:  Unable to complete MRI  Consultations:  NONE  Discharge Exam: Filed Vitals:   10/16/13 0412  BP: 142/66  Pulse: 74  Temp: 98.8 F (37.1 C)  Resp: 18    General: Awake, pleasant, does not remember meeting me yesterday or discussing her bladder infection. Cardiovascular: rrr, no m/r/g Respiratory: CTA  Abdomen:  Soft, nt, nd, +bs, no masses Extremities:  Able to move all 4, 5/5 strength in each. Psych:  Patient with mild confusion, short term memory loss.   Discharge Instructions      Discharge Orders   Future Orders Complete By Expires   Diet - low sodium heart healthy  As directed    Increase activity slowly  As directed        Medication List    STOP taking these medications       naproxen sodium 220 MG tablet  Commonly known as:  ANAPROX      TAKE these medications       aspirin 81 MG EC tablet  Take 1 tablet (81 mg total) by mouth daily.     CALCIUM 600 + D PO  Take 1 tablet by mouth daily.     cephALEXin 500 MG capsule  Commonly known as:  KEFLEX  Take 1 capsule (500 mg total) by mouth 2 (two) times daily.     citalopram 40 MG tablet  Commonly known as:  CELEXA  Take 20 mg by mouth every evening.     docusate sodium 100  MG capsule  Commonly known as:  COLACE  Take 1 capsule (100 mg total) by mouth daily.     ibuprofen 200 MG tablet  Commonly known as:  ADVIL,MOTRIN  Take 400 mg by mouth every 6 (six) hours as needed for pain.     Iron 28 MG Tabs  Take 28 mg by mouth daily.     multivitamin with minerals Tabs tablet  Take 1 tablet by mouth daily.     omeprazole 20 MG capsule  Commonly known as:  PRILOSEC  Take 20 mg by mouth daily.     oxybutynin 10 MG 24 hr tablet  Commonly known as:  DITROPAN-XL  Take 10 mg by mouth daily.       No Known Allergies Follow-up Information   Follow up with Ginette Otto, MD On 10/23/2013. (at 4:30 pm)    Specialty:  Internal  Medicine   Contact information:   [address] [place] [address] [phone]        The results of significant diagnostics from this hospitalization (including imaging, microbiology, ancillary and laboratory) are listed below for reference.    Significant Diagnostic Studies: Dg Chest 2 View  10/14/2013   CLINICAL DATA:  80 year old female with aphasia. Initial encounter.  EXAM: CHEST  2 VIEW  COMPARISON:  None.  FINDINGS: Moderate-sized retrocardiac gastric hiatal hernia. Normal cardiac size and mediastinal contours. No pneumothorax, pulmonary edema, pleural effusion or confluent pulmonary opacity. No acute osseous abnormality identified.  IMPRESSION: No acute cardiopulmonary abnormality.  Moderate hiatal hernia.   Electronically Signed   By: Augusto Gamble M.D.   On: 10/14/2013 14:36   Ct Head Wo Contrast  10/14/2013   CLINICAL DATA:  Aphasia  EXAM: CT HEAD WITHOUT CONTRAST  TECHNIQUE: Contiguous axial images were obtained from the base of the skull through the vertex without intravenous contrast.  COMPARISON:  MRI 04/26/2010  FINDINGS: Generalized atrophy. Mild chronic microvascular ischemia in the white matter. Benign basal ganglia cyst on the right is unchanged.  Negative for acute infarct. Negative for hemorrhage or mass. No skull lesion identified.  IMPRESSION: No acute abnormality.  Mild chronic microvascular ischemic change.   Electronically Signed   By: Marlan Palau M.D.   On: 10/14/2013 14:56    Microbiology: Recent Results (from the past 240 hour(s))  URINE CULTURE     Status: None   Collection Time    10/14/13  2:09 PM      Result Value Range Status   Specimen Description URINE, CATHETERIZED   Final   Special Requests NONE   Final   Culture  Setup Time     Final   Value: 10/14/2013 20:31     Performed at [facility] Count     Final   Value: >=100,000 COLONIES/ML     Performed at [facility]   Culture     Final   Value: GRAM  NEGATIVE RODS     Performed at [facility]   Report Status PENDING   Incomplete     Labs: Basic Metabolic Panel:  Recent Labs Lab 10/14/13 1355 10/14/13 1830 10/15/13 0715  NA 138  --  142  K 3.9  --  4.0  CL 100  --  107  CO2 21  --  22  GLUCOSE 88  --  92  BUN 16  --  16  CREATININE 1.01 1.04 0.93  CALCIUM 9.7  --  8.3*   Liver  Function Tests:  Recent Labs Lab 10/14/13 1355  AST 21  ALT 10  ALKPHOS 62  BILITOT 0.3  PROT 6.9  ALBUMIN 3.6   CBC:  Recent Labs Lab 10/14/13 1355 10/14/13 1830 10/15/13 0715  WBC 9.3 11.2* 10.1  NEUTROABS 5.8  --   --   HGB 13.7 12.8 11.4*  HCT 40.4 37.2 33.9*  MCV 91.0 90.7 91.6  PLT 258 216 202     Signed:  Conley CanalYork, Marianne L, PA-C [phone]  [facility] 10/16/2013, 11:28 AM  Attending Seen and examined, and agree with the outlined assessment and plan. Patient continues to be somewhat alert and at baseline. Suspect she has some amount of dementia. Long discussion with daughter at bedside, outpatient followup with PCP will be arranged. She is significantly improved, for stable for discharge to continue with oral antibiotics. Further workup for dementia will be done in the outpatient setting.  Windell NorfolkS Augusta Hilbert MD

## 2013-10-16 NOTE — Progress Notes (Signed)
Physical Therapy Discharge Patient Details Name: Chloe Wong MRN: [id number] DOB: November 14, 1930 Today's Date: 10/16/2013 Time: 0174-9449 PT Time Calculation (min): 18 min  Patient discharged from PT services secondary to goals met and further needs can be addressed by HHPT.  Please see latest therapy progress note for current level of functioning and progress toward goals.    Progress and discharge plan discussed with patient and/or caregiver: Patient/Caregiver agrees with plan  GP     [facility] 10/16/2013, 11:16 AM

## 2013-10-16 NOTE — ED Provider Notes (Signed)
Medical screening examination/treatment/procedure(s) were conducted as a shared visit with non-physician practitioner(s) and myself.  I personally evaluated the patient during the encounter.     Toy BakerAnthony T Orbin Mayeux, MD 10/16/13 [id number]

## 2013-10-16 NOTE — Discharge Instructions (Signed)
Do not drive until cleared to do so by your Primary Care Physician.  Please complete all of your antibiotic.    Home health physical therapy and RN will come to your house.

## 2013-10-16 NOTE — Plan of Care (Signed)
Problem: Phase I Progression Outcomes Goal: Initial discharge plan identified Outcome: Completed/Met Date Met:  10[facility]

## 2014-12-15 ENCOUNTER — Other Ambulatory Visit: Payer: Self-pay | Admitting: Geriatric Medicine

## 2014-12-15 ENCOUNTER — Ambulatory Visit
Admission: RE | Admit: 2014-12-15 | Discharge: 2014-12-15 | Disposition: A | Discharge status: Home / Self Care | Payer: PRIVATE HEALTH INSURANCE | Source: Ambulatory Visit | Attending: Geriatric Medicine | Admitting: Geriatric Medicine

## 2014-12-15 DIAGNOSIS — M542 Cervicalgia: Secondary | ICD-10-CM

## 2015-05-17 ENCOUNTER — Encounter (HOSPITAL_COMMUNITY): Payer: Self-pay | Admitting: *Deleted

## 2015-05-17 ENCOUNTER — Observation Stay (HOSPITAL_COMMUNITY)
Admission: EM | Admit: 2015-05-17 | Discharge: 2015-05-20 | Disposition: A | Discharge status: Home / Self Care | Payer: Medicare Other | Attending: Internal Medicine | Admitting: Internal Medicine

## 2015-05-17 DIAGNOSIS — D72829 Elevated white blood cell count, unspecified: Secondary | ICD-10-CM | POA: Diagnosis not present

## 2015-05-17 DIAGNOSIS — E785 Hyperlipidemia, unspecified: Secondary | ICD-10-CM | POA: Diagnosis not present

## 2015-05-17 DIAGNOSIS — N393 Stress incontinence (female) (male): Secondary | ICD-10-CM | POA: Diagnosis not present

## 2015-05-17 DIAGNOSIS — F329 Major depressive disorder, single episode, unspecified: Secondary | ICD-10-CM | POA: Diagnosis not present

## 2015-05-17 DIAGNOSIS — D649 Anemia, unspecified: Principal | ICD-10-CM | POA: Insufficient documentation

## 2015-05-17 DIAGNOSIS — M1990 Unspecified osteoarthritis, unspecified site: Secondary | ICD-10-CM | POA: Insufficient documentation

## 2015-05-17 DIAGNOSIS — Z79899 Other long term (current) drug therapy: Secondary | ICD-10-CM | POA: Insufficient documentation

## 2015-05-17 DIAGNOSIS — R0600 Dyspnea, unspecified: Secondary | ICD-10-CM | POA: Diagnosis not present

## 2015-05-17 DIAGNOSIS — R509 Fever, unspecified: Secondary | ICD-10-CM | POA: Diagnosis not present

## 2015-05-17 DIAGNOSIS — R32 Unspecified urinary incontinence: Secondary | ICD-10-CM

## 2015-05-17 DIAGNOSIS — R5383 Other fatigue: Secondary | ICD-10-CM | POA: Diagnosis present

## 2015-05-17 DIAGNOSIS — Z791 Long term (current) use of non-steroidal anti-inflammatories (NSAID): Secondary | ICD-10-CM | POA: Insufficient documentation

## 2015-05-17 DIAGNOSIS — K219 Gastro-esophageal reflux disease without esophagitis: Secondary | ICD-10-CM | POA: Insufficient documentation

## 2015-05-17 DIAGNOSIS — F32A Depression, unspecified: Secondary | ICD-10-CM

## 2015-05-17 LAB — CBC WITH DIFFERENTIAL/PLATELET
BASOS ABS: 0 10*3/uL (ref 0.0–0.1)
Basophils Relative: 0 % (ref 0–1)
Eosinophils Absolute: 0 10*3/uL (ref 0.0–0.7)
Eosinophils Relative: 0 % (ref 0–5)
HCT: 21.8 % — ABNORMAL LOW (ref 36.0–46.0)
Hemoglobin: 6.1 g/dL — CL (ref 12.0–15.0)
LYMPHS PCT: 18 % (ref 12–46)
Lymphs Abs: 2.1 10*3/uL (ref 0.7–4.0)
MCH: 18.7 pg — ABNORMAL LOW (ref 26.0–34.0)
MCHC: 28 g/dL — ABNORMAL LOW (ref 30.0–36.0)
MCV: 66.9 fL — ABNORMAL LOW (ref 78.0–100.0)
MONO ABS: 1.1 10*3/uL — AB (ref 0.1–1.0)
Monocytes Relative: 9 % (ref 3–12)
Neutro Abs: 8.7 10*3/uL — ABNORMAL HIGH (ref 1.7–7.7)
Neutrophils Relative %: 73 % (ref 43–77)
PLATELETS: 325 10*3/uL (ref 150–400)
RBC: 3.26 MIL/uL — AB (ref 3.87–5.11)
RDW: 19.2 % — ABNORMAL HIGH (ref 11.5–15.5)
WBC: 11.9 10*3/uL — AB (ref 4.0–10.5)

## 2015-05-17 LAB — FOLATE: Folate: 38 ng/mL (ref >5.9)

## 2015-05-17 LAB — COMPREHENSIVE METABOLIC PANEL
ALBUMIN: 3.7 g/dL (ref 3.5–5.0)
ALT: 13 U/L — ABNORMAL LOW (ref 14–54)
AST: 21 U/L (ref 15–41)
Alkaline Phosphatase: 78 U/L (ref 38–126)
Anion gap: 8 (ref 5–15)
BUN: 12 mg/dL (ref 6–20)
CHLORIDE: 106 mmol/L (ref 101–111)
CO2: 22 mmol/L (ref 22–32)
CREATININE: 0.86 mg/dL (ref 0.44–1.00)
Calcium: 9.1 mg/dL (ref 8.9–10.3)
GFR calc non Af Amer: >60 mL/min (ref >60)
GLUCOSE: 111 mg/dL — AB (ref 65–99)
Potassium: 3.6 mmol/L (ref 3.5–5.1)
Sodium: 136 mmol/L (ref 135–145)
Total Bilirubin: 0.3 mg/dL (ref 0.3–1.2)
Total Protein: 6.9 g/dL (ref 6.5–8.1)

## 2015-05-17 LAB — PREPARE RBC (CROSSMATCH)

## 2015-05-17 LAB — RETICULOCYTES
RBC.: 3.13 MIL/uL — AB (ref 3.87–5.11)
RETIC CT PCT: 1.7 % (ref 0.4–3.1)
Retic Count, Absolute: 53.2 10*3/uL (ref 19.0–186.0)

## 2015-05-17 LAB — POC OCCULT BLOOD, ED: Fecal Occult Bld: NEGATIVE

## 2015-05-17 LAB — ABO/RH: ABO/RH(D): B POS

## 2015-05-17 MED ORDER — SODIUM CHLORIDE 0.9 % IJ SOLN
3.0000 mL | Freq: Two times a day (BID) | INTRAMUSCULAR | Status: DC
  Administered 2015-05-17 – 2015-05-20 (×6): 3 mL via INTRAVENOUS

## 2015-05-17 MED ORDER — SODIUM CHLORIDE 0.45 % IV SOLN
INTRAVENOUS | Status: DC
  Administered 2015-05-18: 04:00:00 via INTRAVENOUS

## 2015-05-17 MED ORDER — SODIUM CHLORIDE 0.9 % IV SOLN: Freq: Once | INTRAVENOUS | Status: DC

## 2015-05-17 MED ORDER — ONDANSETRON HCL 4 MG PO TABS: 4.0000 mg | ORAL_TABLET | Freq: Four times a day (QID) | ORAL | Status: DC | PRN

## 2015-05-17 MED ORDER — PRAVASTATIN SODIUM 20 MG PO TABS
10.0000 mg | ORAL_TABLET | Freq: Every day | ORAL | Status: DC
  Administered 2015-05-18 – 2015-05-20 (×3): 10 mg via ORAL
  Filled 2015-05-17 (×3): qty 1

## 2015-05-17 MED ORDER — HYDROCODONE-ACETAMINOPHEN 5-325 MG PO TABS
1.0000 | ORAL_TABLET | ORAL | Status: DC | PRN
  Administered 2015-05-17 – 2015-05-18 (×2): 1 via ORAL
  Filled 2015-05-17 (×2): qty 1

## 2015-05-17 MED ORDER — ONDANSETRON HCL 4 MG/2ML IJ SOLN: 4.0000 mg | Freq: Four times a day (QID) | INTRAMUSCULAR | Status: DC | PRN

## 2015-05-17 MED ORDER — SUCRALFATE 1 GM/10ML PO SUSP
1.0000 g | Freq: Three times a day (TID) | ORAL | Status: DC
  Administered 2015-05-17 – 2015-05-20 (×11): 1 g via ORAL
  Filled 2015-05-17 (×10): qty 10

## 2015-05-17 MED ORDER — MIRABEGRON ER 50 MG PO TB24
50.0000 mg | ORAL_TABLET | Freq: Every day | ORAL | Status: DC
  Administered 2015-05-17 – 2015-05-20 (×4): 50 mg via ORAL
  Filled 2015-05-17 (×4): qty 1

## 2015-05-17 MED ORDER — CITALOPRAM HYDROBROMIDE 20 MG PO TABS
10.0000 mg | ORAL_TABLET | Freq: Every day | ORAL | Status: DC
  Administered 2015-05-18 – 2015-05-20 (×3): 10 mg via ORAL
  Filled 2015-05-17 (×3): qty 1

## 2015-05-17 MED ORDER — ACETAMINOPHEN 650 MG RE SUPP: 650.0000 mg | Freq: Four times a day (QID) | RECTAL | Status: DC | PRN

## 2015-05-17 MED ORDER — ACETAMINOPHEN 325 MG PO TABS
650.0000 mg | ORAL_TABLET | Freq: Four times a day (QID) | ORAL | Status: DC | PRN
  Administered 2015-05-18 – 2015-05-19 (×2): 650 mg via ORAL
  Filled 2015-05-17 (×3): qty 2

## 2015-05-17 MED ORDER — PANTOPRAZOLE SODIUM 40 MG IV SOLR
40.0000 mg | Freq: Two times a day (BID) | INTRAVENOUS | Status: DC
  Administered 2015-05-17 – 2015-05-20 (×6): 40 mg via INTRAVENOUS
  Filled 2015-05-17 (×6): qty 40

## 2015-05-17 NOTE — Progress Notes (Signed)
RN notified the PCP on call that the patient is requesting her night medications.

## 2015-05-17 NOTE — ED Notes (Signed)
Hospitalist at bedside 

## 2015-05-17 NOTE — ED Notes (Signed)
Pt states that she has felt "fatigued for past month". PCP did bloodwork and called pt saying that she needed to go to the ED for a blood transfusion. Pt is from [facility].

## 2015-05-17 NOTE — ED Provider Notes (Signed)
CSN: [id number]     Arrival date & time 05/17/15  1556 History   First MD Initiated Contact with Patient 05/17/15 1616     Chief Complaint  Patient presents with  . Fatigue     (Consider location/radiation/quality/duration/timing/severity/associated sxs/prior Treatment) HPI  81 year old female presents with progressive fatigue over the last 1 month. Her primary care physician did blood work this morning due to how fatigued she was and noticed that she was anemic and sent her to the ER for a blood transfusion. She does not know what her hemoglobin was. She denies any blood loss or being on any blood thinners. No black or bloody stools. No abdominal pain. Denies chest pain or shortness of breath but has felt lightheaded. Has not passed out. Feels like she is more pale than normal.  Past Medical History  Diagnosis Date  . Urine, incontinence, stress female   . GERD (gastroesophageal reflux disease)   . Arthritis     right leg  . Hyperlipidemia   . Anemia   . Depression    Past Surgical History  Procedure Laterality Date  . Bladder lift  9 yrs ago  . Cholecystectomy  20 yrs ago  . Colonoscopy with propofol N/A 07/07/2013    Procedure: COLONOSCOPY WITH PROPOFOL;  Surgeon: Charolett Bumpers, MD;  Location: [facility];  Service: Endoscopy;  Laterality: N/A;  . Esophagogastroduodenoscopy (egd) with propofol N/A 07/07/2013    Procedure: ESOPHAGOGASTRODUODENOSCOPY (EGD) WITH PROPOFOL;  Surgeon: Charolett Bumpers, MD;  Location: [facility];  Service: Endoscopy;  Laterality: N/A;   History reviewed. No pertinent family history. Social History  Substance Use Topics  . Smoking status: Never Smoker   . Smokeless tobacco: Never Used  . Alcohol Use: No   OB History    No data available     Review of Systems  Constitutional: Positive for fatigue.  Respiratory: Negative for shortness of breath.   Cardiovascular: Negative for chest pain.  Gastrointestinal: Negative for vomiting,  abdominal pain and blood in stool.  Neurological: Positive for light-headedness. Negative for syncope.  All other systems reviewed and are negative.     Allergies  Review of patient's allergies indicates no known allergies.  Home Medications   Prior to Admission medications   Medication Sig Start Date End Date Taking? Authorizing Provider  aspirin EC 81 MG EC tablet Take 1 tablet (81 mg total) by mouth daily. 10/16/13   Tora Kindred York, PA-C  Calcium Carbonate-Vitamin D (CALCIUM 600 + D PO) Take 1 tablet by mouth daily.    Historical Provider, MD  cephALEXin (KEFLEX) 500 MG capsule Take 1 capsule (500 mg total) by mouth 2 (two) times daily. 10/16/13   Tora Kindred York, PA-C  citalopram (CELEXA) 40 MG tablet Take 20 mg by mouth every evening.    Historical Provider, MD  docusate sodium (COLACE) 100 MG capsule Take 1 capsule (100 mg total) by mouth daily. 10/16/13   Stephani Police, PA-C  Ferrous Sulfate (IRON) 28 MG TABS Take 28 mg by mouth daily.    Historical Provider, MD  ibuprofen (ADVIL,MOTRIN) 200 MG tablet Take 400 mg by mouth every 6 (six) hours as needed for pain.    Historical Provider, MD  Multiple Vitamin (MULTIVITAMIN WITH MINERALS) TABS tablet Take 1 tablet by mouth daily.    Historical Provider, MD  omeprazole (PRILOSEC) 20 MG capsule Take 20 mg by mouth daily.    Historical Provider, MD  oxybutynin (DITROPAN-XL) 10 MG 24 hr tablet Take 10  mg by mouth daily.    Historical Provider, MD   BP 126/53 mmHg  Pulse 95  Temp(Src) 98.6 F (37 C) (Oral)  Resp 18  SpO2 95% Physical Exam  Constitutional: She is oriented to person, place, and time. She appears well-developed and well-nourished.  HENT:  Head: Normocephalic and atraumatic.  Right Ear: External ear normal.  Left Ear: External ear normal.  Nose: Nose normal.  Eyes: Right eye exhibits no discharge. Left eye exhibits no discharge.  Pale conjunctivae   Cardiovascular: Normal rate, regular rhythm and normal heart sounds.    Pulmonary/Chest: Effort normal and breath sounds normal.  Abdominal: Soft. There is no tenderness.  Genitourinary: Rectal exam shows external hemorrhoid (small, nonthrombosed). Rectal exam shows no internal hemorrhoid, no mass and no tenderness. Guaiac negative stool.  Neurological: She is alert and oriented to person, place, and time.  Skin: Skin is warm and dry. There is pallor.  Nursing note and vitals reviewed.   ED Course  Procedures (including critical care time) Labs Review Labs Reviewed  CBC WITH DIFFERENTIAL/PLATELET - Abnormal; Notable for the following:    WBC 11.9 (*)    RBC 3.26 (*)    Hemoglobin 6.1 (*)    HCT 21.8 (*)    MCV 66.9 (*)    MCH 18.7 (*)    MCHC 28.0 (*)    RDW 19.2 (*)    Neutro Abs 8.7 (*)    Monocytes Absolute 1.1 (*)    All other components within normal limits  COMPREHENSIVE METABOLIC PANEL - Abnormal; Notable for the following:    Glucose, Bld 111 (*)    ALT 13 (*)    All other components within normal limits  RETICULOCYTES - Abnormal; Notable for the following:    RBC. 3.13 (*)    All other components within normal limits  VITAMIN B12  FOLATE  IRON AND TIBC  FERRITIN  POC OCCULT BLOOD, ED  TYPE AND SCREEN  PREPARE RBC (CROSSMATCH)  ABO/RH    Imaging Review No results found. I have personally reviewed and evaluated these images and lab results as part of my medical decision-making.   EKG Interpretation None      MDM   Final diagnoses:  Symptomatic anemia    It is unclear why the patient is anemic. The patient is symptomatic with lightheadedness without syncope. No anginal symptoms. The patient has no abdominal pain and no history of rectal bleeding. Hemoccult-negative here. Patient will be transfused and admitted to the hospitalist for further evaluation.    Pricilla Loveless, MD 05/17/15 [phone]

## 2015-05-17 NOTE — ED Notes (Signed)
Bed: ZO10 Expected date:  Expected time:  Means of arrival:  Comments: Pt needs Blood transfusion

## 2015-05-17 NOTE — ED Notes (Signed)
Pt can go to floor at 18:00 

## 2015-05-17 NOTE — H&P (Signed)
[facility] History and Physical  Chloe Wong XBJ:[id number] DOB: 1931/06/26 DOA: 05/17/2015  Referring physician: Dr Merril Abbe PCP: Ginette Otto, MD   Chief Complaint: Generalized weakness and anemia  HPI: Chloe Wong is a 81 y.o. female   Generalized wekaness. Constant. Getting worse. Nothing makes her symptoms better. Worse w/ exertion. No recent change in medications. Multiple times patient states that she feels like she was going to fall,  While ambulatingbut has not fallen. Denies any chest pain, short of breath, palpitations, fevers, nausea, vomiting, hemoptysis, hematochezia, melena. Daily soft bowel movements. Her GI doctor is Dr. Laural Benes. She states that over the last 6 months though she is taken to ibuprofen every single day for mild musculoskeletal pain and as a "pick me up. ".   Review of Systems:  Constitutional: No weight loss, night sweats, Fevers HEENT:  No headaches, Difficulty swallowing,Tooth/dental problems,Sore throat,  No sneezing, itching, ear ache, nasal congestion, post nasal drip,  Cardio-vascular:  No chest pain, Orthopnea, PND, swelling in lower extremities, anasarca, dizziness, palpitations  GI:  No heartburn, indigestion, abdominal pain, nausea, vomiting, diarrhea, change in bowel habits, loss of appetite  Resp:   No shortness of breath with exertion or at rest. No excess mucus, no productive cough, No non-productive cough, No coughing up of blood.No change in color of mucus.No wheezing.No chest wall deformity  Skin:  no rash or lesions.  GU:  no dysuria, change in color of urine, no urgency or frequency. No flank pain.  Musculoskeletal:   No joint pain or swelling. No decreased range of motion. No back pain.  Psych:  No change in mood or affect. No depression or anxiety. No memory loss.   Past Medical History  Diagnosis Date  . Urine, incontinence, stress female   . GERD (gastroesophageal reflux disease)   .  Arthritis     right leg  . Hyperlipidemia   . Anemia   . Depression    Past Surgical History  Procedure Laterality Date  . Bladder lift  9 yrs ago  . Cholecystectomy  20 yrs ago  . Colonoscopy with propofol N/A 07/07/2013    Procedure: COLONOSCOPY WITH PROPOFOL;  Surgeon: Charolett Bumpers, MD;  Location: [facility];  Service: Endoscopy;  Laterality: N/A;  . Esophagogastroduodenoscopy (egd) with propofol N/A 07/07/2013    Procedure: ESOPHAGOGASTRODUODENOSCOPY (EGD) WITH PROPOFOL;  Surgeon: Charolett Bumpers, MD;  Location: [facility];  Service: Endoscopy;  Laterality: N/A;   Social History:  reports that she has never smoked. She has never used smokeless tobacco. She reports that she does not drink alcohol or use illicit drugs.  No Known Allergies  Family History  Problem Relation Age of Onset  . Heart disease Father   . Cancer Mother      Prior to Admission medications   Medication Sig Start Date End Date Taking? Authorizing Provider  citalopram (CELEXA) 10 MG tablet Take 10 mg by mouth daily. 05/05/15  Yes Historical Provider, MD  loratadine (CLARITIN) 10 MG tablet Take 10 mg by mouth daily.   Yes Historical Provider, MD  Multiple Vitamin (MULTIVITAMIN WITH MINERALS) TABS tablet Take 1 tablet by mouth 2 (two) times daily.    Yes Historical Provider, MD  MYRBETRIQ 50 MG TB24 tablet Take 50 mg by mouth daily. 05/05/15  Yes Historical Provider, MD  omeprazole (PRILOSEC) 20 MG capsule Take 20 mg by mouth daily.   Yes Historical Provider, MD  pravastatin (PRAVACHOL) 10 MG tablet Take 10  mg by mouth daily. 04/15/15  Yes Historical Provider, MD  aspirin EC 81 MG EC tablet Take 1 tablet (81 mg total) by mouth daily. Patient not taking: Reported on 05/17/2015 10/16/13   Stephani Police, PA-C  cephALEXin (KEFLEX) 500 MG capsule Take 1 capsule (500 mg total) by mouth 2 (two) times daily. Patient not taking: Reported on 05/17/2015 10/16/13   Stephani Police, PA-C  docusate sodium (COLACE) 100  MG capsule Take 1 capsule (100 mg total) by mouth daily. Patient not taking: Reported on 05/17/2015 10/16/13   Stephani Police, PA-C   Physical Exam: Filed Vitals:   05/17/15 1809 05/17/15 1834 05/17/15 2006 05/17/15 2036  BP:  152/73 129/55 123/53  Pulse:   91 88  Temp:  99.3 F (37.4 C) 99 F (37.2 C) 99.6 F (37.6 C)  TempSrc:  Oral Oral Oral  Resp:   18 18  Height:  5\' 6"  (1.676 m)    Weight:  74.163 kg (163 lb 8 oz)    SpO2: 95% 100% 98%     Wt Readings from Last 3 Encounters:  05/17/15 74.163 kg (163 lb 8 oz)  10/14/13 71.3 kg (157 lb 3 oz)  07/07/13 69.854 kg (154 lb)    General:  Appears calm and comfortable Eyes:  PERRL, normal lids, irises & conjunctiva ENT:  grossly normal hearing, lips & tongue Neck:  no LAD, masses or thyromegaly Cardiovascular:  RRR, no m/r/g. No LE edema. Telemetry:  SR, no arrhythmias  Respiratory:  CTA bilaterally, no w/r/r. Normal respiratory effort. Abdomen:  soft, ntnd Skin:  no rash or induration seen on limited exam Musculoskeletal:  grossly normal tone BUE/BLE Psychiatric:  grossly normal mood and affect, speech fluent and appropriate Neurologic:  grossly non-focal.          Labs on Admission:  Basic Metabolic Panel:  Recent Labs Lab 05/17/15 1624  NA 136  K 3.6  CL 106  CO2 22  GLUCOSE 111*  BUN 12  CREATININE 0.86  CALCIUM 9.1   Liver Function Tests:  Recent Labs Lab 05/17/15 1624  AST 21  ALT 13*  ALKPHOS 78  BILITOT 0.3  PROT 6.9  ALBUMIN 3.7   No results for input(s): LIPASE, AMYLASE in the last 168 hours. No results for input(s): AMMONIA in the last 168 hours. CBC:  Recent Labs Lab 05/17/15 1624  WBC 11.9*  NEUTROABS 8.7*  HGB 6.1*  HCT 21.8*  MCV 66.9*  PLT 325   Cardiac Enzymes: No results for input(s): CKTOTAL, CKMB, CKMBINDEX, TROPONINI in the last 168 hours.  BNP (last 3 results) No results for input(s): BNP in the last 8760 hours.  ProBNP (last 3 results) No results for input(s):  PROBNP in the last 8760 hours.  CBG: No results for input(s): GLUCAP in the last 168 hours.  Radiological Exams on Admission: No results found.   Assessment/Plan Principal Problem:   Symptomatic anemia Active Problems:   Incontinence in female   HLD (hyperlipidemia)   GERD without esophagitis   Depression   Symptomatic Microcytic Anemia: Hgb 6.1 on admission. Baseline 11.5. Microcytic with MCV of 66.9. Patient with apparent history of iron deficiency. Suspect also factorial etiology including iron deficiency as well as GI loss. Hemoccult negative in ED but patient likely intermittently bleeding. Daily NSAID use 6 months puts patient at high risk for this. Last colonoscopy, and endoscopy in 2014 were normal. All other hematology cell lines nml.  - Telemetry - Transfuse 2 units PRBC -  post transfusion labs - CBC in a.m. - Recommending either inpatient or outpatient GI follow-up - Anemia panel (ordered by ED) - IV protonix - Carafate  Incontinence: - continue myrbetriq  HLD - continue statin  GERD: - continue PPI   Depression:  - continue Celexa   Code Status: FULL DVT Prophylaxis: SCD Family Communication: None Disposition Plan: Pendingi mprovement  Yohann Curl J, MD Family Medicine [facility] [email or web address] Password [email or web address]

## 2015-05-18 DIAGNOSIS — D649 Anemia, unspecified: Secondary | ICD-10-CM | POA: Diagnosis not present

## 2015-05-18 LAB — BASIC METABOLIC PANEL
ANION GAP: 5 (ref 5–15)
BUN: 9 mg/dL (ref 6–20)
CALCIUM: 8.7 mg/dL — AB (ref 8.9–10.3)
CO2: 24 mmol/L (ref 22–32)
Chloride: 109 mmol/L (ref 101–111)
Creatinine, Ser: 0.76 mg/dL (ref 0.44–1.00)
GFR calc non Af Amer: >60 mL/min (ref >60)
Glucose, Bld: 99 mg/dL (ref 65–99)
Potassium: 3.8 mmol/L (ref 3.5–5.1)
Sodium: 138 mmol/L (ref 135–145)

## 2015-05-18 LAB — FERRITIN: FERRITIN: 6 ng/mL — AB (ref 11–307)

## 2015-05-18 LAB — CBC
HEMATOCRIT: 28 % — AB (ref 36.0–46.0)
HEMOGLOBIN: 8.6 g/dL — AB (ref 12.0–15.0)
MCH: 22.1 pg — ABNORMAL LOW (ref 26.0–34.0)
MCHC: 30.7 g/dL (ref 30.0–36.0)
MCV: 72 fL — ABNORMAL LOW (ref 78.0–100.0)
Platelets: 245 10*3/uL (ref 150–400)
RBC: 3.89 MIL/uL (ref 3.87–5.11)
RDW: 21 % — AB (ref 11.5–15.5)
WBC: 9.4 10*3/uL (ref 4.0–10.5)

## 2015-05-18 LAB — IRON AND TIBC
Iron: 66 ug/dL (ref 28–170)
SATURATION RATIOS: 15 % (ref 10.4–31.8)
TIBC: 441 ug/dL (ref 250–450)
UIBC: 375 ug/dL

## 2015-05-18 LAB — VITAMIN B12: VITAMIN B 12: 587 pg/mL (ref 180–914)

## 2015-05-18 NOTE — Evaluation (Signed)
Physical Therapy Evaluation Patient Details Name: Chloe Wong MRN: [id number] DOB: 08-Jul-1931 Today's Date: 05/18/2015   History of Present Illness  81 yo female admitted with symptomatic anemia. Pt lives alone  Clinical Impression  On eval, pt was Min guard assist for mobility-walked ~175 feet. Unsteady but no overt LOB. Pt lives alone. Feel pt could potentially benefit from HHPT for general strengthening to maximize independence and safety with functional mobility in home environment.    Follow Up Recommendations Home health PT    Equipment Recommendations  None recommended by PT    Recommendations for Other Services       Precautions / Restrictions Precautions Precautions: Fall Restrictions Weight Bearing Restrictions: No      Mobility  Bed Mobility               General bed mobility comments: pt oob  Transfers Overall transfer level: Needs assistance   Transfers: Sit to/from Stand Sit to Stand: Min guard         General transfer comment: close guard for safety. Increased time  Ambulation/Gait Ambulation/Gait assistance: Min guard Ambulation Distance (Feet): 175 Feet Assistive device: None Gait Pattern/deviations: Step-through pattern;Decreased stride length;Decreased step length - right;Decreased step length - left;Decreased weight shift to left;Decreased weight shift to right     General Gait Details: close guard for safety. unsteady but no overt LOB. Dyspnea 2/4. O2 sats 94% on RA.   Stairs            Wheelchair Mobility    Modified Rankin (Stroke Patients Only)       Balance Overall balance assessment: Needs assistance         Standing balance support: During functional activity Standing balance-Leahy Scale: Good                               Pertinent Vitals/Pain Pain Assessment: No/denies pain    Home Living Family/patient expects to be discharged to:: Private residence Living Arrangements: Alone    Type of Home: Independent living facility Home Access: Level entry;Elevator     Home Layout: One level Home Equipment: None      Prior Function Level of Independence: Independent               Hand Dominance        Extremity/Trunk Assessment   Upper Extremity Assessment: Generalized weakness           Lower Extremity Assessment: Generalized weakness      Cervical / Trunk Assessment: Normal  Communication   Communication: No difficulties  Cognition Arousal/Alertness: Awake/alert Behavior During Therapy: WFL for tasks assessed/performed Overall Cognitive Status: Within Functional Limits for tasks assessed                      General Comments      Exercises        Assessment/Plan    PT Assessment Patient needs continued PT services  PT Diagnosis Difficulty walking;Generalized weakness   PT Problem List Decreased strength;Decreased activity tolerance;Decreased balance;Decreased mobility  PT Treatment Interventions DME instruction;Gait training;Functional mobility training;Therapeutic activities;Patient/family education;Balance training;Therapeutic exercise   PT Goals (Current goals can be found in the Care Plan section) Acute Rehab PT Goals Patient Stated Goal: home soon. regain PLOF PT Goal Formulation: With patient/family Time For Goal Achievement: 05/25/15 Potential to Achieve Goals: Good    Frequency Min 3X/week   Barriers to discharge  Co-evaluation               End of Session Equipment Utilized During Treatment: Gait belt Activity Tolerance: Patient limited by fatigue Patient left: in chair;with call bell/phone within reach;with chair alarm set;with family/visitor present      Functional Assessment Tool Used: clinical judgement Functional Limitation: Mobility: Walking and moving around Mobility: Walking and Moving Around Current Status (M8413): At least 1 percent but less than 20 percent impaired, limited or  restricted Mobility: Walking and Moving Around Goal Status [phone]): 0 percent impaired, limited or restricted    Time: 0272-5366 PT Time Calculation (min) (ACUTE ONLY): 32 min   Charges:   PT Evaluation $Initial PT Evaluation Tier I: 1 Procedure     PT G Codes:   PT G-Codes **NOT FOR INPATIENT CLASS** Functional Assessment Tool Used: clinical judgement Functional Limitation: Mobility: Walking and moving around Mobility: Walking and Moving Around Current Status (Y4034): At least 1 percent but less than 20 percent impaired, limited or restricted Mobility: Walking and Moving Around Goal Status [phone]): 0 percent impaired, limited or restricted    Rebeca Alert, MPT Pager: [phone]

## 2015-05-18 NOTE — Progress Notes (Signed)
Initial Nutrition Assessment  DOCUMENTATION CODES:   Not applicable  INTERVENTION:  - Advance diet as medically feasible - Will monitor for need for supplements with diet advancement  NUTRITION DIAGNOSIS:   Inadequate oral intake related to acute illness, poor appetite as evidenced by per patient/family report.  GOAL:   Patient will meet greater than or equal to 90% of their needs  MONITOR:   Diet advancement, PO intake, Weight trends, Labs, I & O's  REASON FOR ASSESSMENT:   Malnutrition Screening Tool  ASSESSMENT:   Generalized wekaness. Constant. Getting worse. Nothing makes her symptoms better. Worse w/ exertion. No recent change in medications. Multiple times patient states that she feels like she was going to fall, While ambulatingbut has not fallen. Denies any chest pain, short of breath, palpitations, fevers, nausea, vomiting, hemoptysis, hematochezia, melena. Daily soft bowel movements. Her GI doctor is Dr. Laural Benes. She states that over the last 6 months though she is taken to ibuprofen every single day for mild musculoskeletal pain and as a "pick me up. ".   Pt seen for MST. BMI indicates overweight status. Pt on CLD and states that she drank a can of Coca-Cola for breakfast after trying the coffee, which was "too strong" for her. She states that for the 1 month PTA she was experiencing decreased appetite and increased weakness; she states she lives at an assisted living facility and did not have the energy to go to the cafeteria to eat meals some days.  She states that this morning she is feeling very hungry and, several times, she requests solid foods stating this is the only thing she really wants at this time. She denies abdominal pain or nausea PTA.  Limited weight hx available in pt chart. She denies weight changes PTA. Not able to meet needs on CLD. Medications reviewed. Labs reviewed; Ca: 8.7 mg/dL.    Diet Order:  Diet clear liquid Room service appropriate?:  Yes; Fluid consistency:: Thin  Skin:  Reviewed, no issues  Last BM:  8/29  Height:   Ht Readings from Last 1 Encounters:  05/17/15  (1.676 m)    Weight:   Wt Readings from Last 1 Encounters:  05/17/15 163 lb 8 oz (74.163 kg)    Ideal Body Weight:  59.09 kg (kg)  BMI:  Body mass index is 26.4 kg/(m^2).  Estimated Nutritional Needs:   Kcal:  1480-1680  Protein:  60-70 grams  Fluid:  1.8-2.2 L/day  EDUCATION NEEDS:   No education needs identified at this time     Trenton Gammon, RD, LDN Inpatient Clinical Dietitian Pager # [phone] After hours/weekend pager # [phone]

## 2015-05-18 NOTE — Progress Notes (Addendum)
Patient ID: Chloe Wong, female   DOB: 01/02/1931, 81 y.o.   MRN: [id number]  [facility] PROGRESS NOTE  Chloe Wong [id number]:[id number] DOB: 13-Feb-1931 DOA: 05/17/2015 PCP: Ginette Otto, MD   Brief narrative:    Patient is 81 year old female who presented to [facility] emergency department with main concern of several days duration of progressively work sitting generalized weakness, fatigue, poor oral intake, exertional dyspnea.  In ED, blood work notable for hemoglobin 6.1, 2 units of PRBC ordered for transfusion and [facility] asked to admit for further evaluation. Please note that patient has denied blood in urine or stool, no hemoptysis or hematochezia. She did report use of ibuprofen daily for generalized muscle aches. Last colonoscopy, and endoscopy in 2014 were normal.  Assessment/Plan:    Principal Problem:   Symptomatic anemia - at baseline Hg ~ 11 but now likely exacerbated by NSAID use - pt advised to stop taking NSAID's - transfused two units of PRBC with appropriate post transfusion increase in Hg - pt still denies blood in urine or stool - continue Protonix, follow up on anemia panel  - repeat CBC in AM - d/w with GI on call if observation OK   Active Problems:   Exertional dyspnea - from acute blood loss anemia - stable oxygen saturations - PT evaluation    Leukocytosis - likely from principal problem - now resolved    HLD (hyperlipidemia) - continue statin     Depression - stable, continue Celexa   DVT prophylaxis - SCD's  Code Status: Full.  Family Communication:  plan of care discussed with the patient Disposition Plan: Home in 24 hours if H/H stable with no transfusion   IV access:  Peripheral IV  Procedures and diagnostic studies:    No results found.  Medical Consultants:  GI over the phone   Other Consultants:  PT   IAnti-Infectives:   None   Debbora Presto, MD  [facility] Pager [phone]  If 7PM-7AM, please contact  night-coverage [email or web address] Password [facility] 05/18/2015, 11:42 AM  HPI/Subjective: No events overnight.   Objective: Filed Vitals:   05/18/15 0114 05/18/15 0314 05/18/15 0432 05/18/15 0744  BP: 125/55 135/52 138/62 136/65  Pulse: 77 80 79 79  Temp: 98.6 F (37 C) 98 F (36.7 C) 98.2 F (36.8 C) 99.1 F (37.3 C)  TempSrc: Oral Oral Oral Oral  Resp: 16 16 18 20   Height:      Weight:      SpO2:  96% 96% 96%    Intake/Output Summary (Last 24 hours) at 05/18/15 1142 Last data filed at 05/18/15 1010  Gross per 24 hour  Intake 1477.5 ml  Output   1100 ml  Net  377.5 ml    Exam:   General:  Pt is alert, follows commands appropriately, not in acute distress  Cardiovascular: Regular rate and rhythm, no rubs, no gallops  Respiratory: Clear to auscultation bilaterally, no wheezing, no crackles, no rhonchi  Abdomen: Soft, non tender, non distended, bowel sounds present, no guarding  Extremities: No edema, pulses DP and PT palpable bilaterally  Neuro: Grossly nonfocal  Data Reviewed: Basic Metabolic Panel:  Recent Labs Lab 05/17/15 1624 05/18/15 0520  NA 136 138  K 3.6 3.8  CL 106 109  CO2 22 24  GLUCOSE 111* 99  BUN 12 9  CREATININE 0.86 0.76  CALCIUM 9.1 8.7*   Liver Function Tests:  Recent Labs Lab 05/17/15 1624  AST 21  ALT 13*  ALKPHOS 78  BILITOT 0.3  PROT 6.9  ALBUMIN 3.7   CBC:  Recent Labs Lab 05/17/15 1624 05/18/15 0520  WBC 11.9* 9.4  NEUTROABS 8.7*  --   HGB 6.1* 8.6*  HCT 21.8* 28.0*  MCV 66.9* 72.0*  PLT 325 245   Scheduled Meds: . citalopram  10 mg Oral Daily  . mirabegron ER  50 mg Oral Daily  . pantoprazole IV  40 mg Intravenous Q12H  . pravastatin  10 mg Oral Daily  . sucralfate  1 g Oral TID WC & HS   Continuous Infusions: . sodium chloride 75 mL/hr at 05/18/15 0356

## 2015-05-19 ENCOUNTER — Observation Stay (HOSPITAL_COMMUNITY): Payer: Medicare Other

## 2015-05-19 DIAGNOSIS — D649 Anemia, unspecified: Secondary | ICD-10-CM | POA: Diagnosis not present

## 2015-05-19 LAB — URINALYSIS, ROUTINE W REFLEX MICROSCOPIC
BILIRUBIN URINE: NEGATIVE
GLUCOSE, UA: NEGATIVE mg/dL
Ketones, ur: NEGATIVE mg/dL
Nitrite: POSITIVE — AB
PROTEIN: NEGATIVE mg/dL
SPECIFIC GRAVITY, URINE: 1.018 (ref 1.005–1.030)
UROBILINOGEN UA: 1 mg/dL (ref 0.0–1.0)
pH: 6 (ref 5.0–8.0)

## 2015-05-19 LAB — CBC
HCT: 28.8 % — ABNORMAL LOW (ref 36.0–46.0)
Hemoglobin: 8.8 g/dL — ABNORMAL LOW (ref 12.0–15.0)
MCH: 21.9 pg — ABNORMAL LOW (ref 26.0–34.0)
MCHC: 30.6 g/dL (ref 30.0–36.0)
MCV: 71.8 fL — ABNORMAL LOW (ref 78.0–100.0)
Platelets: 237 10*3/uL (ref 150–400)
RBC: 4.01 MIL/uL (ref 3.87–5.11)
RDW: 21.1 % — AB (ref 11.5–15.5)
WBC: 9.4 10*3/uL (ref 4.0–10.5)

## 2015-05-19 LAB — URINE MICROSCOPIC-ADD ON

## 2015-05-19 LAB — TYPE AND SCREEN
ABO/RH(D): B POS
ANTIBODY SCREEN: NEGATIVE
UNIT DIVISION: 0
Unit division: 0

## 2015-05-19 LAB — BASIC METABOLIC PANEL
Anion gap: 6 (ref 5–15)
BUN: 7 mg/dL (ref 6–20)
CALCIUM: 8.9 mg/dL (ref 8.9–10.3)
CO2: 24 mmol/L (ref 22–32)
CREATININE: 0.82 mg/dL (ref 0.44–1.00)
Chloride: 108 mmol/L (ref 101–111)
GFR calc non Af Amer: >60 mL/min (ref >60)
Glucose, Bld: 103 mg/dL — ABNORMAL HIGH (ref 65–99)
Potassium: 4.3 mmol/L (ref 3.5–5.1)
SODIUM: 138 mmol/L (ref 135–145)

## 2015-05-19 NOTE — Progress Notes (Signed)
Patient ID: Chloe Wong, female   DOB: 1930/10/09, 81 y.o.   MRN: [id number]  [facility] PROGRESS NOTE  Chloe Wong:[id number] DOB: [birth year] DOA: 05/17/2015 PCP: Ginette Otto, MD   Brief narrative:    Patient is 81 year old female who presented to [facility] emergency department with main concern of several days duration of progressively work sitting generalized weakness, fatigue, poor oral intake, exertional dyspnea.  In ED, blood work notable for hemoglobin 6.1, 2 units of PRBC ordered for transfusion and [facility] asked to admit for further evaluation. Please note that patient has denied blood in urine or stool, no hemoptysis or hematochezia. She did report use of ibuprofen daily for generalized muscle aches. Last colonoscopy, and endoscopy in 2014 were normal.  Assessment/Plan:    Principal Problem:   Symptomatic anemia - at baseline Hg ~ 11 but now likely exacerbated by NSAID use - pt advised to stop taking NSAID's - transfused two units of PRBC with appropriate post transfusion increase in Hg - Hg remains stable in the past 24 hours - pt still denies blood in urine or stool - continue Protonix, repeat CBC In AM    Fever overnight - Tmax 100.8 F, no clear etiology, pt with no specific urinary or cardiopulmonary symptoms, no abd concerns - will ask for UA and CXR  - hold of on ABX for now - monitor fever trend   Active Problems:   Exertional dyspnea - from acute blood loss anemia - stable oxygen saturations - CXR requested as noted above  - PT evaluation requested as well     Leukocytosis - likely from principal problem, demargination and stress reaction  - now resolved    HLD (hyperlipidemia) - continue statin     Depression - stable, continue Celexa   DVT prophylaxis - SCD's  Code Status: Full.  Family Communication:  plan of care discussed with the patient and son over the phone  Disposition Plan: Home in 24 hours if H/H stable and no  fevers   IV access:  Peripheral IV  Procedures and diagnostic studies:    No results found.  Medical Consultants:  GI over the phone   Other Consultants:  PT   IAnti-Infectives:   None   Debbora Presto, MD  [facility] Pager [phone]  If 7PM-7AM, please contact night-coverage [email or web address] Password [email or web address] 05/19/2015, 11:35 AM  HPI/Subjective: No events overnight. Fever this AM 100.8 F  Objective: Filed Vitals:   05/18/15 1400 05/18/15 2138 05/18/15 2238 05/19/15 0436  BP: 120/53 140/69  141/57  Pulse: 91 89  82  Temp: 99.3 F (37.4 C) 100.8 F (38.2 C) 99.4 F (37.4 C) 99.2 F (37.3 C)  TempSrc: Oral Oral Oral Oral  Resp: 16 18  18   Height:      Weight:      SpO2: 94% 94%  96%    Intake/Output Summary (Last 24 hours) at 05/19/15 1135 Last data filed at 05/19/15 1045  Gross per 24 hour  Intake    600 ml  Output   1650 ml  Net  -1050 ml    Exam:   General:  Pt is alert, follows commands appropriately, not in acute distress  Cardiovascular: Regular rate and rhythm, no rubs, no gallops  Respiratory: Clear to auscultation bilaterally, no wheezing, no crackles, no rhonchi  Abdomen: Soft, non tender, non distended, bowel sounds present, no guarding  Extremities: No edema, pulses DP and PT palpable bilaterally  Neuro: Grossly nonfocal  Data Reviewed:  Basic Metabolic Panel:  Recent Labs Lab 05/17/15 1624 05/18/15 0520 05/19/15 0518  NA 136 138 138  K 3.6 3.8 4.3  CL 106 109 108  CO2 GLUCOSE 111* 99 103*  BUN CREATININE 0.86 0.76 0.82  CALCIUM 9.1 8.7* 8.9   Liver Function Tests:  Recent Labs Lab 05/17/15 1624  AST 21  ALT 13*  ALKPHOS 78  BILITOT 0.3  PROT 6.9  ALBUMIN 3.7   CBC:  Recent Labs Lab 05/17/15 1624 05/18/15 0520 05/19/15 0518  WBC 11.9* 9.4 9.4  NEUTROABS 8.7*  --   --   HGB 6.1* 8.6* 8.8*  HCT 21.8* 28.0* 28.8*  MCV 66.9* 72.0* 71.8*  PLT 325 245 237   Scheduled Meds: . citalopram  10 mg  Oral Daily  . mirabegron ER  50 mg Oral Daily  . pantoprazole IV  40 mg Intravenous Q12H  . pravastatin  10 mg Oral Daily  . sucralfate  1 g Oral TID WC & HS   Continuous Infusions:

## 2015-05-20 DIAGNOSIS — D649 Anemia, unspecified: Secondary | ICD-10-CM | POA: Diagnosis not present

## 2015-05-20 LAB — CBC
HCT: 29.4 % — ABNORMAL LOW (ref 36.0–46.0)
HEMOGLOBIN: 8.9 g/dL — AB (ref 12.0–15.0)
MCH: 21.9 pg — AB (ref 26.0–34.0)
MCHC: 30.3 g/dL (ref 30.0–36.0)
MCV: 72.2 fL — AB (ref 78.0–100.0)
PLATELETS: 215 10*3/uL (ref 150–400)
RBC: 4.07 MIL/uL (ref 3.87–5.11)
RDW: 21.9 % — ABNORMAL HIGH (ref 11.5–15.5)
WBC: 7.2 10*3/uL (ref 4.0–10.5)

## 2015-05-20 LAB — BASIC METABOLIC PANEL
ANION GAP: 8 (ref 5–15)
BUN: 8 mg/dL (ref 6–20)
CHLORIDE: 107 mmol/L (ref 101–111)
CO2: 25 mmol/L (ref 22–32)
Calcium: 9.1 mg/dL (ref 8.9–10.3)
Creatinine, Ser: 0.8 mg/dL (ref 0.44–1.00)
GFR calc Af Amer: >60 mL/min (ref >60)
Glucose, Bld: 91 mg/dL (ref 65–99)
POTASSIUM: 3.3 mmol/L — AB (ref 3.5–5.1)
SODIUM: 140 mmol/L (ref 135–145)

## 2015-05-20 MED ORDER — PANTOPRAZOLE SODIUM 40 MG PO TBEC: 40.0000 mg | DELAYED_RELEASE_TABLET | Freq: Every day | ORAL | Status: DC

## 2015-05-20 MED ORDER — SUCRALFATE 1 GM/10ML PO SUSP: 1.0000 g | Freq: Three times a day (TID) | ORAL | Status: DC

## 2015-05-20 MED ORDER — CEFUROXIME AXETIL 250 MG PO TABS
250.0000 mg | ORAL_TABLET | Freq: Two times a day (BID) | ORAL | Status: DC
  Administered 2015-05-20: 250 mg via ORAL
  Filled 2015-05-20 (×3): qty 1

## 2015-05-20 MED ORDER — CEFUROXIME AXETIL 250 MG PO TABS: 250.0000 mg | ORAL_TABLET | Freq: Two times a day (BID) | ORAL | Status: DC

## 2015-05-20 MED ORDER — POTASSIUM CHLORIDE CRYS ER 20 MEQ PO TBCR
40.0000 meq | EXTENDED_RELEASE_TABLET | Freq: Once | ORAL | Status: AC
  Administered 2015-05-20: 40 meq via ORAL
  Filled 2015-05-20: qty 2

## 2015-05-20 NOTE — Progress Notes (Signed)
Pt from [facility].  Pt states that she has no needs, she will get PT from [facility].

## 2015-05-20 NOTE — Discharge Instructions (Signed)
Anemia, Nonspecific Anemia is a condition in which the concentration of red blood cells or hemoglobin in the blood is below normal. Hemoglobin is a substance in red blood cells that carries oxygen to the tissues of the body. Anemia results in not enough oxygen reaching these tissues.  CAUSES  Common causes of anemia include:   Excessive bleeding. Bleeding may be internal or external. This includes excessive bleeding from periods (in women) or from the intestine.   Poor nutrition.   Chronic kidney, thyroid, and liver disease.  Bone marrow disorders that decrease red blood cell production.  Cancer and treatments for cancer.  HIV, AIDS, and their treatments.  Spleen problems that increase red blood cell destruction.  Blood disorders.  Excess destruction of red blood cells due to infection, medicines, and autoimmune disorders. SIGNS AND SYMPTOMS   Minor weakness.   Dizziness.   Headache.  Palpitations.   Shortness of breath, especially with exercise.   Paleness.  Cold sensitivity.  Indigestion.  Nausea.  Difficulty sleeping.  Difficulty concentrating. Symptoms may occur suddenly or they may develop slowly.  DIAGNOSIS  Additional blood tests are often needed. These help your health care provider determine the best treatment. Your health care provider will check your stool for blood and look for other causes of blood loss.  TREATMENT  Treatment varies depending on the cause of the anemia. Treatment can include:   Supplements of iron, vitamin B12, or folic acid.   Hormone medicines.   A blood transfusion. This may be needed if blood loss is severe.   Hospitalization. This may be needed if there is significant continual blood loss.   Dietary changes.  Spleen removal. HOME CARE INSTRUCTIONS Keep all follow-up appointments. It often takes many weeks to correct anemia, and having your health care provider check on your condition and your response to  treatment is very important. SEEK IMMEDIATE MEDICAL CARE IF:   You develop extreme weakness, shortness of breath, or chest pain.   You become dizzy or have trouble concentrating.  You develop heavy vaginal bleeding.   You develop a rash.   You have bloody or black, tarry stools.   You faint.   You vomit up blood.   You vomit repeatedly.   You have abdominal pain.  You have a fever or persistent symptoms for more than 2-3 days.   You have a fever and your symptoms suddenly get worse.   You are dehydrated.  MAKE SURE YOU:  Understand these instructions.  Will watch your condition.  Will get help right away if you are not doing well or get worse. Document Released: 10/11/2004 Document Revised: 05/06/2013 Document Reviewed: 02/27/2013 ExitCare Patient Information 2015 [employer]. This information is not intended to replace advice given to you by your health care provider. Make sure you discuss any questions you have with your health care provider.  

## 2015-05-20 NOTE — Discharge Summary (Signed)
Physician Discharge Summary  Chloe Wong Kenner WUJ:[id number] DOB: 12/13/1930 DOA: 05/17/2015  PCP: Ginette Otto, MD  Admit date: 05/17/2015 Discharge date: 05/20/2015  Recommendations for Outpatient Follow-up:  1. Pt will need to follow up with PCP in 2-3 weeks post discharge 2. Please obtain BMP to evaluate electrolytes and kidney function 3. Please also check CBC to evaluate Hg and Hct levels  Discharge Diagnoses:  Principal Problem:   Symptomatic anemia Active Problems:   Incontinence in female   HLD (hyperlipidemia)   GERD without esophagitis   Depression  Discharge Condition: Stable  Diet recommendation: Heart healthy diet discussed in details    Brief narrative:    Patient is 81 year old female who presented to [facility] emergency department with main concern of several days duration of progressively work sitting generalized weakness, fatigue, poor oral intake, exertional dyspnea. In ED, blood work notable for hemoglobin 6.1, 2 units of PRBC ordered for transfusion and [facility] asked to admit for further evaluation. Please note that patient has denied blood in urine or stool, no hemoptysis or hematochezia. She did report use of ibuprofen daily for generalized muscle aches. Last colonoscopy, and endoscopy in 2014 were normal.  Assessment/Plan:    Principal Problem:  Symptomatic anemia - at baseline Hg ~ 11 but now likely exacerbated by NSAID use - pt advised to stop taking NSAID's - transfused two units of PRBC with appropriate post transfusion increase in Hg - Hg remains stable in the past 24 hours   Fever overnight - possibly secondary to UTU - continue Ceftin upon discharge to complete therapy   Active Problems:  Exertional dyspnea - from acute blood loss anemia - stable oxygen saturations   Leukocytosis - likely from principal problem, demargination and stress reaction, UTI - now resolved   HLD (hyperlipidemia) - continue statin     Depression - stable, continue Celexa   Code Status: Full.  Family Communication: plan of care discussed with the patient and son over the phone  Disposition Plan: Home   IV access:  Peripheral IV  Procedures and diagnostic studies:    Imaging Results    No results found.    Medical Consultants:  GI over the phone   Other Consultants:  PT         Discharge Exam: Filed Vitals:   05/20/15 0458  BP: 155/77  Pulse: 77  Temp: 97.9 F (36.6 C)  Resp: 16   Filed Vitals:   05/19/15 0436 05/19/15 1339 05/19/15 2038 05/20/15 0458  BP: 141/57 139/63 153/66 155/77  Pulse: 82 83 93 77  Temp: 99.2 F (37.3 C) 99.3 F (37.4 C) 98.7 F (37.1 C) 97.9 F (36.6 C)  TempSrc: Oral Oral Oral Oral  Resp: Height:      Weight:      SpO2: 96% 99% 94% 96%    General: Pt is alert, follows commands appropriately, not in acute distress Cardiovascular: Regular rate and rhythm, no rubs, no gallops Respiratory: Clear to auscultation bilaterally, no wheezing, no crackles, no rhonchi Abdominal: Soft, non tender, non distended, bowel sounds +, no guarding  Discharge Instructions  Discharge Instructions    Diet - low sodium heart healthy    Complete by:  As directed      Increase activity slowly    Complete by:  As directed             Medication List    STOP taking these medications  aspirin 81 MG EC tablet     cephALEXin 500 MG capsule  Commonly known as:  KEFLEX     docusate sodium 100 MG capsule  Commonly known as:  COLACE      TAKE these medications        cefUROXime 250 MG tablet  Commonly known as:  CEFTIN  Take 1 tablet (250 mg total) by mouth 2 (two) times daily with a meal.     citalopram 10 MG tablet  Commonly known as:  CELEXA  Take 10 mg by mouth daily.     loratadine 10 MG tablet  Commonly known as:  CLARITIN  Take 10 mg by mouth daily.     multivitamin with minerals Tabs tablet  Take 1 tablet by mouth 2 (two)  times daily.     MYRBETRIQ 50 MG Tb24 tablet  Generic drug:  mirabegron ER  Take 50 mg by mouth daily.     omeprazole 20 MG capsule  Commonly known as:  PRILOSEC  Take 20 mg by mouth daily.     pantoprazole 40 MG tablet  Commonly known as:  PROTONIX  Take 1 tablet (40 mg total) by mouth daily.     pravastatin 10 MG tablet  Commonly known as:  PRAVACHOL  Take 10 mg by mouth daily.     sucralfate 1 GM/10ML suspension  Commonly known as:  CARAFATE  Take 10 mLs (1 g total) by mouth 4 (four) times daily -  with meals and at bedtime.           Follow-up Information    Follow up with Ginette Otto, MD. Go on 06/01/2015.   Specialty:  Internal Medicine   Why:  at 4:15pm  For Post Hospitalization Follow Up, Bring a list of medications or bottles., Bring Insurance & Phote ID, Bring Co-pay, Take your Discharge Instruction to your appt   Contact information:   [address] [place] [address] [phone]        The results of significant diagnostics from this hospitalization (including imaging, microbiology, ancillary and laboratory) are listed below for reference.     Microbiology: No results found for this or any previous visit (from the past 240 hour(s)).   Labs: Basic Metabolic Panel:  Recent Labs Lab 05/17/15 1624 05/18/15 0520 05/19/15 0518 05/20/15 0543  NA 136 138 138 140  K 3.6 3.8 4.3 3.3*  CL 106 109 108 107  CO2 22 24 24 25   GLUCOSE 111* 99 103* 91  BUN 12 9 7 8   CREATININE 0.86 0.76 0.82 0.80  CALCIUM 9.1 8.7* 8.9 9.1   Liver Function Tests:  Recent Labs Lab 05/17/15 1624  AST 21  ALT 13*  ALKPHOS 78  BILITOT 0.3  PROT 6.9  ALBUMIN 3.7   No results for input(s): LIPASE, AMYLASE in the last 168 hours. No results for input(s): AMMONIA in the last 168 hours. CBC:  Recent Labs Lab 05/17/15 1624 05/18/15 0520 05/19/15 0518 05/20/15 0543  WBC 11.9* 9.4 9.4 7.2  NEUTROABS 8.7*  --   --   --   HGB 6.1* 8.6* 8.8*  8.9*  HCT 21.8* 28.0* 28.8* 29.4*  MCV 66.9* 72.0* 71.8* 72.2*  PLT 325 245 237 215   Cardiac Enzymes: No results for input(s): CKTOTAL, CKMB, CKMBINDEX, TROPONINI in the last 168 hours. BNP: BNP (last 3 results) No results for input(s): BNP in the last 8760 hours.  ProBNP (last 3 results) No results for input(s): PROBNP in  the last 8760 hours.  CBG: No results for input(s): GLUCAP in the last 168 hours.   SIGNED: Time coordinating discharge: 30 minutes  Debbora Presto, MD  [facility] 05/20/2015, 10:45 AM Pager [phone]  If 7PM-7AM, please contact night-coverage [email or web address] Password [email or web address]

## 2015-05-20 NOTE — Progress Notes (Signed)
Completed D/C teaching with patient. Patient will be taken back to [place] by her sister. Patient is in stable condition.

## 2015-05-20 NOTE — Progress Notes (Signed)
Physical Therapy Treatment Patient Details Name: Chloe Wong MRN: [id number] DOB: 1931/07/30 Today's Date: 05/20/2015    History of Present Illness 81 yo female admitted with symptomatic anemia. Pt lives alone    PT Comments    Noted some increased dyspnea with mobility on today- 2-3/4 with ambulation. O2 sats >90% on RA. Discussed energy conservation/taking rest breaks as needed. LOB during session today while turning/changing direction. Encouraged pt to take her time and to be as safe as possible. Feel HHPT for general strengthening and balance training would be beneficial.   Follow Up Recommendations  Home health PT (for general strength and balance training)     Equipment Recommendations  None recommended by PT    Recommendations for Other Services       Precautions / Restrictions Precautions Precautions: Fall Restrictions Weight Bearing Restrictions: No    Mobility  Bed Mobility               General bed mobility comments: pt oob  Transfers     Transfers: Sit to/from Stand Sit to Stand: Supervision         General transfer comment: for safety.  Ambulation/Gait Ambulation/Gait assistance: Min guard Ambulation Distance (Feet): 275 Feet Assistive device: None Gait Pattern/deviations: Step-through pattern;Decreased stride length;Decreased weight shift to left;Decreased weight shift to right;Decreased step length - right;Decreased step length - left     General Gait Details: close guard for safety. LOB x1 in room while pt was turning around. Pt was able to brace herself on bed to prevent fall.  Dyspnea 2-3/4. O2 sats 94% on RA.    Stairs            Wheelchair Mobility    Modified Rankin (Stroke Patients Only)       Balance                             High level balance activites: Side stepping;Backward walking;Direction changes;Turns High Level Balance Comments: LOb x1 with turns 180 degrees/360 degrees-Min assist to  stabilize. close guard for all other tasks.     Cognition Arousal/Alertness: Awake/alert Behavior During Therapy: WFL for tasks assessed/performed Overall Cognitive Status: Within Functional Limits for tasks assessed                      Exercises      General Comments        Pertinent Vitals/Pain Pain Assessment: No/denies pain    Home Living                      Prior Function            PT Goals (current goals can now be found in the care plan section) Progress towards PT goals: Progressing toward goals    Frequency  Min 3X/week    PT Plan Current plan remains appropriate    Co-evaluation             End of Session Equipment Utilized During Treatment: Gait belt Activity Tolerance: Patient limited by fatigue Patient left: in chair;with call bell/phone within reach;with chair alarm set     Time: [phone] PT Time Calculation (min) (ACUTE ONLY): 16 min  Charges:  $Gait Training: 8-22 mins                    G Codes:  Functional Assessment Tool Used: clinical judgement Functional Limitation: Mobility: Walking and  moving around Mobility: Walking and Moving Around Current Status [phone]): At least 1 percent but less than 20 percent impaired, limited or restricted Mobility: Walking and Moving Around Goal Status [phone]): 0 percent impaired, limited or restricted Mobility: Walking and Moving Around Discharge Status [phone]): At least 1 percent but less than 20 percent impaired, limited or restricted   Rebeca Alert, MPT Pager: [phone]

## 2015-06-12 ENCOUNTER — Encounter (HOSPITAL_COMMUNITY): Payer: Self-pay | Admitting: Family Medicine

## 2015-06-12 ENCOUNTER — Inpatient Hospital Stay (HOSPITAL_COMMUNITY)
Admission: EM | Admit: 2015-06-12 | Discharge: 2015-06-17 | DRG: 872 | Disposition: A | Discharge status: Home / Self Care | Payer: Medicare Other | Attending: Internal Medicine | Admitting: Internal Medicine

## 2015-06-12 ENCOUNTER — Emergency Department (HOSPITAL_COMMUNITY): Payer: Medicare Other

## 2015-06-12 DIAGNOSIS — N390 Urinary tract infection, site not specified: Secondary | ICD-10-CM | POA: Diagnosis present

## 2015-06-12 DIAGNOSIS — I10 Essential (primary) hypertension: Secondary | ICD-10-CM | POA: Diagnosis present

## 2015-06-12 DIAGNOSIS — M1990 Unspecified osteoarthritis, unspecified site: Secondary | ICD-10-CM | POA: Diagnosis present

## 2015-06-12 DIAGNOSIS — E785 Hyperlipidemia, unspecified: Secondary | ICD-10-CM | POA: Diagnosis present

## 2015-06-12 DIAGNOSIS — Z8249 Family history of ischemic heart disease and other diseases of the circulatory system: Secondary | ICD-10-CM

## 2015-06-12 DIAGNOSIS — D649 Anemia, unspecified: Secondary | ICD-10-CM | POA: Diagnosis not present

## 2015-06-12 DIAGNOSIS — D509 Iron deficiency anemia, unspecified: Secondary | ICD-10-CM | POA: Diagnosis present

## 2015-06-12 DIAGNOSIS — R509 Fever, unspecified: Secondary | ICD-10-CM | POA: Diagnosis present

## 2015-06-12 DIAGNOSIS — N393 Stress incontinence (female) (male): Secondary | ICD-10-CM | POA: Diagnosis present

## 2015-06-12 DIAGNOSIS — F329 Major depressive disorder, single episode, unspecified: Secondary | ICD-10-CM | POA: Diagnosis present

## 2015-06-12 DIAGNOSIS — K219 Gastro-esophageal reflux disease without esophagitis: Secondary | ICD-10-CM | POA: Diagnosis present

## 2015-06-12 DIAGNOSIS — A4151 Sepsis due to Escherichia coli [E. coli]: Principal | ICD-10-CM | POA: Diagnosis present

## 2015-06-12 DIAGNOSIS — A419 Sepsis, unspecified organism: Secondary | ICD-10-CM

## 2015-06-12 DIAGNOSIS — B9689 Other specified bacterial agents as the cause of diseases classified elsewhere: Secondary | ICD-10-CM | POA: Diagnosis present

## 2015-06-12 LAB — CBC WITH DIFFERENTIAL/PLATELET
BASOS PCT: 0 %
Basophils Absolute: 0 10*3/uL (ref 0.0–0.1)
EOS PCT: 0 %
Eosinophils Absolute: 0 10*3/uL (ref 0.0–0.7)
HCT: 31.8 % — ABNORMAL LOW (ref 36.0–46.0)
HEMOGLOBIN: 9.6 g/dL — AB (ref 12.0–15.0)
LYMPHS PCT: 8 %
Lymphs Abs: 0.9 10*3/uL (ref 0.7–4.0)
MCH: 22.1 pg — AB (ref 26.0–34.0)
MCHC: 30.2 g/dL (ref 30.0–36.0)
MCV: 73.1 fL — AB (ref 78.0–100.0)
MONO ABS: 0.9 10*3/uL (ref 0.1–1.0)
Monocytes Relative: 8 %
NEUTROS PCT: 84 %
Neutro Abs: 9.8 10*3/uL — ABNORMAL HIGH (ref 1.7–7.7)
Platelets: 319 10*3/uL (ref 150–400)
RBC: 4.35 MIL/uL (ref 3.87–5.11)
RDW: 24.2 % — ABNORMAL HIGH (ref 11.5–15.5)
WBC: 11.6 10*3/uL — ABNORMAL HIGH (ref 4.0–10.5)

## 2015-06-12 LAB — URINE MICROSCOPIC-ADD ON

## 2015-06-12 LAB — URINALYSIS, ROUTINE W REFLEX MICROSCOPIC
Bilirubin Urine: NEGATIVE
GLUCOSE, UA: NEGATIVE mg/dL
Ketones, ur: 40 mg/dL — AB
Nitrite: POSITIVE — AB
PH: 6 (ref 5.0–8.0)
Protein, ur: NEGATIVE mg/dL
Specific Gravity, Urine: 1.014 (ref 1.005–1.030)
Urobilinogen, UA: 1 mg/dL (ref 0.0–1.0)

## 2015-06-12 LAB — BASIC METABOLIC PANEL
Anion gap: 8 (ref 5–15)
BUN: 11 mg/dL (ref 6–20)
CHLORIDE: 105 mmol/L (ref 101–111)
CO2: 23 mmol/L (ref 22–32)
Calcium: 9 mg/dL (ref 8.9–10.3)
Creatinine, Ser: 0.91 mg/dL (ref 0.44–1.00)
GFR calc Af Amer: >60 mL/min (ref >60)
GFR calc non Af Amer: 57 mL/min — ABNORMAL LOW (ref >60)
GLUCOSE: 123 mg/dL — AB (ref 65–99)
POTASSIUM: 3.8 mmol/L (ref 3.5–5.1)
Sodium: 136 mmol/L (ref 135–145)

## 2015-06-12 MED ORDER — DEXTROSE 5 % IV SOLN
2.0000 g | Freq: Three times a day (TID) | INTRAVENOUS | Status: DC
  Administered 2015-06-12: 2 g via INTRAVENOUS
  Filled 2015-06-12 (×2): qty 2

## 2015-06-12 MED ORDER — SODIUM CHLORIDE 0.9 % IV BOLUS (SEPSIS)
1000.0000 mL | INTRAVENOUS | Status: AC
  Administered 2015-06-12 (×2): 1000 mL via INTRAVENOUS

## 2015-06-12 MED ORDER — ACETAMINOPHEN 500 MG PO TABS
500.0000 mg | ORAL_TABLET | ORAL | Status: DC | PRN
  Administered 2015-06-12 – 2015-06-15 (×3): 500 mg via ORAL
  Filled 2015-06-12 (×3): qty 1

## 2015-06-12 MED ORDER — VANCOMYCIN HCL IN DEXTROSE 1-5 GM/200ML-% IV SOLN
1000.0000 mg | Freq: Once | INTRAVENOUS | Status: AC
  Administered 2015-06-12: 1000 mg via INTRAVENOUS
  Filled 2015-06-12: qty 200

## 2015-06-12 MED ORDER — SODIUM CHLORIDE 0.9 % IV BOLUS (SEPSIS)
500.0000 mL | INTRAVENOUS | Status: AC
  Administered 2015-06-12: 500 mL via INTRAVENOUS

## 2015-06-12 NOTE — ED Notes (Signed)
Delay in lab draw, provider in with pt 

## 2015-06-12 NOTE — ED Notes (Signed)
Patient is from home and was transported by [facility]. Pt is complaining of dizziness, lightheaded, nasal congestion this morning. Denies syncopal episode, chest pain, shortness of breath but also has nausea. Pt usually has upper extremity tremors.

## 2015-06-12 NOTE — ED Provider Notes (Signed)
CSN: [id number]     Arrival date & time 06/12/15  2007 History   First MD Initiated Contact with Patient 06/12/15 2029     Chief Complaint  Patient presents with  . Dizziness     (Consider location/radiation/quality/duration/timing/severity/associated sxs/prior Treatment) HPI  Chloe Wong is a 81 yo female with HTN, anemia, and GERD, presenting with nausea and upper airway congestion.  She is not sure how long she has been nauseated (unable to say whether day, month, or year). She denies vomiting.  She believes her nausea may be associated with her medications.  She is not sure what medications she takes.    She also complains of congestion and runny nose for 1 month.  She endorses intermittent cough productive of white sputum.  She endorses subjective fevers and chills, but she has not taken her temperature.  She also complains of feeling lightheaded and dizzy today. She denies falls.  She endorses good PO intake of food and fluids.  Past Medical History  Diagnosis Date  . Urine, incontinence, stress female   . GERD (gastroesophageal reflux disease)   . Arthritis     right leg  . Hyperlipidemia   . Anemia   . Depression    Past Surgical History  Procedure Laterality Date  . Bladder lift  9 yrs ago  . Cholecystectomy  20 yrs ago  . Colonoscopy with propofol N/A 07/07/2013    Procedure: COLONOSCOPY WITH PROPOFOL;  Surgeon: Charolett Bumpers, MD;  Location: [facility];  Service: Endoscopy;  Laterality: N/A;  . Esophagogastroduodenoscopy (egd) with propofol N/A 07/07/2013    Procedure: ESOPHAGOGASTRODUODENOSCOPY (EGD) WITH PROPOFOL;  Surgeon: Charolett Bumpers, MD;  Location: [facility];  Service: Endoscopy;  Laterality: N/A;   Family History  Problem Relation Age of Onset  . Heart disease Father   . Cancer Mother    Social History  Substance Use Topics  . Smoking status: Never Smoker   . Smokeless tobacco: Never Used  . Alcohol Use: No   OB History    No data  available     Review of Systems  Constitutional: Positive for fever and chills. Negative for diaphoresis, activity change, appetite change, fatigue and unexpected weight change.  HENT: Positive for congestion and rhinorrhea.   Respiratory: Positive for cough. Negative for chest tightness, shortness of breath and wheezing.   Cardiovascular: Negative for chest pain, palpitations and leg swelling.  Gastrointestinal: Negative for nausea, vomiting, abdominal pain, diarrhea and constipation.  Genitourinary: Negative for dysuria, urgency and frequency.  Skin: Negative for rash.  Neurological: Positive for dizziness, tremors and light-headedness. Negative for syncope.      Allergies  Review of patient's allergies indicates no known allergies.  Home Medications   Prior to Admission medications   Medication Sig Start Date End Date Taking? Authorizing Provider  cefUROXime (CEFTIN) 250 MG tablet Take 1 tablet (250 mg total) by mouth 2 (two) times daily with a meal. 05/20/15   Dorothea Ogle, MD  citalopram (CELEXA) 10 MG tablet Take 10 mg by mouth daily. 05/05/15   Historical Provider, MD  loratadine (CLARITIN) 10 MG tablet Take 10 mg by mouth daily.    Historical Provider, MD  Multiple Vitamin (MULTIVITAMIN WITH MINERALS) TABS tablet Take 1 tablet by mouth 2 (two) times daily.     Historical Provider, MD  MYRBETRIQ 50 MG TB24 tablet Take 50 mg by mouth daily. 05/05/15   Historical Provider, MD  omeprazole (PRILOSEC) 20 MG capsule Take 20 mg by mouth  daily.    Historical Provider, MD  pantoprazole (PROTONIX) 40 MG tablet Take 1 tablet (40 mg total) by mouth daily. 05/20/15   Dorothea Ogle, MD  pravastatin (PRAVACHOL) 10 MG tablet Take 10 mg by mouth daily. 04/15/15   Historical Provider, MD  sucralfate (CARAFATE) 1 GM/10ML suspension Take 10 mLs (1 g total) by mouth 4 (four) times daily -  with meals and at bedtime. 05/20/15   Dorothea Ogle, MD   BP 128/60 mmHg  Pulse 99  Temp(Src) 100.9 F (38.3 C)  (Oral)  Resp 16  Ht  (1.676 m)  Wt 162 lb (73.483 kg)  BMI 26.16 kg/m2  SpO2 96% Physical Exam  Constitutional: She is oriented to person, place, and time. She appears well-developed and well-nourished. No distress.  HENT:  Head: Normocephalic and atraumatic.  Eyes: EOM are normal.  Neck: Normal range of motion. No JVD present. No tracheal deviation present.  Cardiovascular: Normal rate, regular rhythm, normal heart sounds and intact distal pulses.   Pulmonary/Chest: Effort normal and breath sounds normal. No respiratory distress. She has no wheezes. She has no rales.  No crackles.  Abdominal: Soft. She exhibits no distension. There is no tenderness. There is no rebound and no guarding.  Musculoskeletal: Normal range of motion.  Trace edema to midshin.  Neurological: She is alert and oriented to person, place, and time.  Skin: Skin is dry. She is not diaphoretic. No erythema.    ED Course  Procedures (including critical care time) Labs Review Labs Reviewed  CBC WITH DIFFERENTIAL/PLATELET - Abnormal; Notable for the following:    WBC 11.6 (*)    Hemoglobin 9.6 (*)    HCT 31.8 (*)    MCV 73.1 (*)    MCH 22.1 (*)    RDW 24.2 (*)    Neutro Abs 9.8 (*)    All other components within normal limits  BASIC METABOLIC PANEL - Abnormal; Notable for the following:    Glucose, Bld 123 (*)    GFR calc non Af Amer 57 (*)    All other components within normal limits  URINE CULTURE  CULTURE, BLOOD (ROUTINE X 2)  CULTURE, BLOOD (ROUTINE X 2)  URINALYSIS, ROUTINE W REFLEX MICROSCOPIC (NOT AT [facility])    Imaging Review Dg Chest 2 View  06/12/2015   CLINICAL DATA:  Dizziness with cough and congestion, fever  EXAM: CHEST - 2 VIEW  COMPARISON:  05/19/2015  FINDINGS: Cardiac shadow is within normal limits. A large hiatal hernia is again identified. No focal infiltrate or sizable effusion is seen. No bony abnormality is noted.  IMPRESSION: No acute abnormality seen.  Stable hiatal hernia.    Electronically Signed   By: Alcide Clever M.D.   On: 06/12/2015 21:46   I have personally reviewed and evaluated these images and lab results as part of my medical decision-making.   EKG Interpretation None      MDM   Final diagnoses:  None   Sepsis: 4/4 SIRS with dirty UA and past h/o UTI.  Sepsis protocol initiated.  CXR negative.  Will admit for sepsis management. Patient s/p one dose of Vanc and Ceftaz, and 2L NS. - Consult to internal medicine who accepted patient for admission    Chloe Half, MD 06/12/15 1610  Chloe Kaplan, MD 06/14/15 [phone]

## 2015-06-12 NOTE — ED Notes (Signed)
Pts son left phone number [facility] [phone]

## 2015-06-12 NOTE — ED Notes (Signed)
Bed: WA17 Expected date:  Expected time:  Means of arrival:  Comments: 6F lightheaded while walking, seen for same recently

## 2015-06-12 NOTE — H&P (Signed)
[facility] Admission History and Physical       Chloe Wong Enrico ZOX:[id number] DOB: Apr 27, 1931 DOA: 06/12/2015  Referring physician: EDP PCP: Ginette Otto, MD  Specialists:   Chief Complaint: Fever Chills and Nausea  HPI: SHAMICA MOREE Buth is a 81 y.o. female with a history of HTN, Hyperlipidemia who presents tot he ED with complaints of fevers and chills and nausea and Light headedness for the past 2 days.   She denies any Chest, ABD, Back pain and dysuria.  She does report having nasal congestion.   She was found to have a temperature of 102. 6 in the ED and a Sepsis workup was inititated.   She was found to have a +UA, and an Elevated LActic Acid Level and a Negative Chest X-ray.   She was placed on IV Vancomycin and  Ceftazidime and referred for admission.  She was hospitalized 3 weeks ago with Altered Mental Status and UTI and was dicharged to home on Cefuroxime.           Review of Systems:  Constitutional: No Weight Loss, No Weight Gain, Night Sweats, +Fevers,+Chills, Dizziness, Light Headedness, Fatigue, or Generalized Weakness HEENT: No Headaches, Difficulty Swallowing,Tooth/Dental Problems,Sore Throat,  No Sneezing, Rhinitis, Ear Ache, Nasal Congestion, or Post Nasal Drip,  Cardio-vascular:  No Chest pain, Orthopnea, PND, Edema in Lower Extremities, Anasarca, Dizziness, Palpitations  Resp: No Dyspnea, No DOE, No Productive Cough, No Non-Productive Cough, No Hemoptysis, No Wheezing.    GI: No Heartburn, Indigestion, Abdominal Pain, +Nausea, Vomiting, Diarrhea, Constipation, Hematemesis, Hematochezia, Melena, Change in Bowel Habits,  Loss of Appetite  GU: No Dysuria, No Change in Color of Urine, No Urgency or Urinary Frequency, No Flank pain.  Musculoskeletal: No Joint Pain or Swelling, No Decreased Range of Motion, No Back Pain.  Neurologic: No Syncope, No Seizures, Muscle Weakness, Paresthesia, Vision Disturbance or Loss, No Diplopia, No Vertigo, No Difficulty  Walking,  Skin: No Rash or Lesions. Psych: No Change in Mood or Affect, No Depression or Anxiety, No Memory loss, No Confusion, or Hallucinations   Past Medical History  Diagnosis Date  . Urine, incontinence, stress female   . GERD (gastroesophageal reflux disease)   . Arthritis     right leg  . Hyperlipidemia   . Anemia   . Depression      Past Surgical History  Procedure Laterality Date  . Bladder lift  9 yrs ago  . Cholecystectomy  20 yrs ago  . Colonoscopy with propofol N/A 07/07/2013    Procedure: COLONOSCOPY WITH PROPOFOL;  Surgeon: Charolett Bumpers, MD;  Location: [facility];  Service: Endoscopy;  Laterality: N/A;  . Esophagogastroduodenoscopy (egd) with propofol N/A 07/07/2013    Procedure: ESOPHAGOGASTRODUODENOSCOPY (EGD) WITH PROPOFOL;  Surgeon: Charolett Bumpers, MD;  Location: [facility];  Service: Endoscopy;  Laterality: N/A;      Prior to Admission medications   Medication Sig Start Date End Date Taking? Authorizing Provider  cefUROXime (CEFTIN) 250 MG tablet Take 1 tablet (250 mg total) by mouth 2 (two) times daily with a meal. 05/20/15   Dorothea Ogle, MD  citalopram (CELEXA) 10 MG tablet Take 10 mg by mouth daily. 05/05/15   Historical Provider, MD  loratadine (CLARITIN) 10 MG tablet Take 10 mg by mouth daily.    Historical Provider, MD  Multiple Vitamin (MULTIVITAMIN WITH MINERALS) TABS tablet Take 1 tablet by mouth 2 (two) times daily.     Historical Provider, MD  MYRBETRIQ 50 MG TB24 tablet Take  50 mg by mouth daily. 05/05/15   Historical Provider, MD  omeprazole (PRILOSEC) 20 MG capsule Take 20 mg by mouth daily.    Historical Provider, MD  pantoprazole (PROTONIX) 40 MG tablet Take 1 tablet (40 mg total) by mouth daily. 05/20/15   Dorothea Ogle, MD  pravastatin (PRAVACHOL) 10 MG tablet Take 10 mg by mouth daily. 04/15/15   Historical Provider, MD  sucralfate (CARAFATE) 1 GM/10ML suspension Take 10 mLs (1 g total) by mouth 4 (four) times daily -  with meals and  at bedtime. 05/20/15   Dorothea Ogle, MD     No Known Allergies     Social History:  reports that she has never smoked. She has never used smokeless tobacco. She reports that she does not drink alcohol or use illicit drugs.     Family History  Problem Relation Age of Onset  . Heart disease Father   . Cancer Mother        Physical Exam:  GEN:  Pleasant Obese Elderly 81 y.o. Caucasian female examined and in no acute distress; cooperative with exam Filed Vitals:   06/12/15 2139 06/12/15 2218 06/12/15 2230 06/12/15 2300  BP: 134/71 128/60 122/53 126/54  Pulse: 102 99 94 96  Temp:  100.9 F (38.3 C)  98.7 F (37.1 C)  TempSrc:  Oral  Oral  Resp: Height:  (1.676 m)     Weight: 73.483 kg (162 lb)     SpO2: 94% 96% 96% 95%   Blood pressure 126/54, pulse 96, temperature 98.7 F (37.1 C), temperature source Oral, resp. rate 29, height  (1.676 m), weight 73.483 kg (162 lb), SpO2 95 %. PSYCH: She is alert and oriented x4; does not appear anxious does not appear depressed; affect is normal HEENT: Normocephalic and Atraumatic, Mucous membranes pink; PERRLA; EOM intact; Fundi:  Benign;  No scleral icterus, Nares: Patent, Oropharynx: Clear, Fair Dentition,    Neck:  FROM, No Cervical Lymphadenopathy nor Thyromegaly or Carotid Bruit; No JVD; Breasts:: Not examined CHEST WALL: No tenderness CHEST: Normal respiration, clear to auscultation bilaterally HEART: Regular rate and rhythm; no murmurs rubs or gallops BACK: No kyphosis or scoliosis; No CVA tenderness ABDOMEN: Positive Bowel Sounds, Obese, Soft Non-Tender, No Rebound or Guarding; No Masses, No Organomegaly. Rectal Exam: Not done EXTREMITIES: No Cyanosis, Clubbing, or Edema; No Ulcerations. Genitalia: not examined PULSES: 2+ and symmetric SKIN: Normal hydration no rash or ulceration CNS:  Alert and Oriented x 4, No Focal Deficits Vascular: pulses palpable throughout    Labs on Admission:  Basic Metabolic  Panel:  Recent Labs Lab 06/12/15 2052  NA 136  K 3.8  CL 105  CO2 23  GLUCOSE 123*  BUN 11  CREATININE 0.91  CALCIUM 9.0   Liver Function Tests: No results for input(s): AST, ALT, ALKPHOS, BILITOT, PROT, ALBUMIN in the last 168 hours. No results for input(s): LIPASE, AMYLASE in the last 168 hours. No results for input(s): AMMONIA in the last 168 hours. CBC:  Recent Labs Lab 06/12/15 2052  WBC 11.6*  NEUTROABS 9.8*  HGB 9.6*  HCT 31.8*  MCV 73.1*  PLT 319   Cardiac Enzymes: No results for input(s): CKTOTAL, CKMB, CKMBINDEX, TROPONINI in the last 168 hours.  BNP (last 3 results) No results for input(s): BNP in the last 8760 hours.  ProBNP (last 3 results) No results for input(s): PROBNP in the last 8760 hours.  CBG: No results for input(s): GLUCAP in the  last 168 hours.  Radiological Exams on Admission: Dg Chest 2 View  06/12/2015   CLINICAL DATA:  Dizziness with cough and congestion, fever  EXAM: CHEST - 2 VIEW  COMPARISON:  05/19/2015  FINDINGS: Cardiac shadow is within normal limits. A large hiatal hernia is again identified. No focal infiltrate or sizable effusion is seen. No bony abnormality is noted.  IMPRESSION: No acute abnormality seen.  Stable hiatal hernia.   Electronically Signed   By: Alcide Clever M.D.   On: 06/12/2015 21:46     Assessment/Plan:   81 y.o. female with  Principal Problem:   1.     Sepsis   Sepsis Workup, Blood and Urine Cultures sente   IV Vancomycin and Ceftazidime   IVFs   Active Problems:   2.    UTI (lower urinary tract infection)   Covered By Empiric IV Ceftazidime     3.    HTN (hypertension)   Monitor BPs     4.    Anemia   Monitor Hb     5.    DVT Prophylaxis   Lovenox    Code Status:     FULL CODE       Family Communication:    No Family Present    Disposition Plan:    Inpatient Status        Time spent:  34 Minutes      Ron Parker [facility] Pager [phone]   If 7AM -7PM Please  Contact the Day Rounding Team MD for [facility]  If 7PM-7AM, Please Contact Night-Floor Coverage  [email or web address] Password [email or web address] 06/12/2015, 11:29 PM     ADDENDUM:   Patient was seen and examined on 06/12/2015

## 2015-06-13 ENCOUNTER — Encounter (HOSPITAL_COMMUNITY): Payer: Self-pay

## 2015-06-13 DIAGNOSIS — R7881 Bacteremia: Secondary | ICD-10-CM

## 2015-06-13 DIAGNOSIS — D509 Iron deficiency anemia, unspecified: Secondary | ICD-10-CM

## 2015-06-13 DIAGNOSIS — F329 Major depressive disorder, single episode, unspecified: Secondary | ICD-10-CM

## 2015-06-13 LAB — BASIC METABOLIC PANEL
Anion gap: 6 (ref 5–15)
BUN: 10 mg/dL (ref 6–20)
CALCIUM: 8.5 mg/dL — AB (ref 8.9–10.3)
CO2: 23 mmol/L (ref 22–32)
CREATININE: 0.79 mg/dL (ref 0.44–1.00)
Chloride: 110 mmol/L (ref 101–111)
GFR calc Af Amer: >60 mL/min (ref >60)
GLUCOSE: 113 mg/dL — AB (ref 65–99)
Potassium: 3.7 mmol/L (ref 3.5–5.1)
SODIUM: 139 mmol/L (ref 135–145)

## 2015-06-13 LAB — PROCALCITONIN: Procalcitonin: 0.22 ng/mL

## 2015-06-13 LAB — APTT: aPTT: 30 seconds (ref 24–37)

## 2015-06-13 LAB — CBC
HCT: 27.5 % — ABNORMAL LOW (ref 36.0–46.0)
Hemoglobin: 8.3 g/dL — ABNORMAL LOW (ref 12.0–15.0)
MCH: 22.3 pg — ABNORMAL LOW (ref 26.0–34.0)
MCHC: 30.2 g/dL (ref 30.0–36.0)
MCV: 73.7 fL — ABNORMAL LOW (ref 78.0–100.0)
PLATELETS: 266 10*3/uL (ref 150–400)
RBC: 3.73 MIL/uL — ABNORMAL LOW (ref 3.87–5.11)
RDW: 24.6 % — AB (ref 11.5–15.5)
WBC: 10.6 10*3/uL — AB (ref 4.0–10.5)

## 2015-06-13 LAB — LACTIC ACID, PLASMA
LACTIC ACID, VENOUS: 0.7 mmol/L (ref 0.5–2.0)
LACTIC ACID, VENOUS: 0.9 mmol/L (ref 0.5–2.0)

## 2015-06-13 LAB — PROTIME-INR
INR: 1.18 (ref 0.00–1.49)
PROTHROMBIN TIME: 15.1 s (ref 11.6–15.2)

## 2015-06-13 MED ORDER — ONDANSETRON HCL 4 MG/2ML IJ SOLN
4.0000 mg | Freq: Four times a day (QID) | INTRAMUSCULAR | Status: DC | PRN
  Administered 2015-06-13: 4 mg via INTRAVENOUS
  Filled 2015-06-13: qty 2

## 2015-06-13 MED ORDER — VANCOMYCIN HCL IN DEXTROSE 750-5 MG/150ML-% IV SOLN
750.0000 mg | Freq: Two times a day (BID) | INTRAVENOUS | Status: DC
  Administered 2015-06-13 – 2015-06-14 (×3): 750 mg via INTRAVENOUS
  Filled 2015-06-13 (×3): qty 150

## 2015-06-13 MED ORDER — SODIUM CHLORIDE 0.9 % IV BOLUS (SEPSIS): 500.0000 mL | INTRAVENOUS | Status: AC

## 2015-06-13 MED ORDER — ENOXAPARIN SODIUM 40 MG/0.4ML ~~LOC~~ SOLN
40.0000 mg | SUBCUTANEOUS | Status: DC
  Administered 2015-06-13 – 2015-06-17 (×5): 40 mg via SUBCUTANEOUS
  Filled 2015-06-13 (×5): qty 0.4

## 2015-06-13 MED ORDER — PANTOPRAZOLE SODIUM 40 MG PO TBEC
40.0000 mg | DELAYED_RELEASE_TABLET | Freq: Every day | ORAL | Status: DC
  Administered 2015-06-13 – 2015-06-17 (×5): 40 mg via ORAL
  Filled 2015-06-13 (×5): qty 1

## 2015-06-13 MED ORDER — SODIUM CHLORIDE 0.9 % IV BOLUS (SEPSIS)
1000.0000 mL | INTRAVENOUS | Status: AC
  Administered 2015-06-13 (×2): 1000 mL via INTRAVENOUS

## 2015-06-13 MED ORDER — ALUM & MAG HYDROXIDE-SIMETH 200-200-20 MG/5ML PO SUSP: 30.0000 mL | Freq: Four times a day (QID) | ORAL | Status: DC | PRN

## 2015-06-13 MED ORDER — CITALOPRAM HYDROBROMIDE 10 MG PO TABS
10.0000 mg | ORAL_TABLET | Freq: Every day | ORAL | Status: DC
  Administered 2015-06-13 – 2015-06-17 (×5): 10 mg via ORAL
  Filled 2015-06-13 (×5): qty 1

## 2015-06-13 MED ORDER — DEXTROSE 5 % IV SOLN: 2.0000 g | Freq: Three times a day (TID) | INTRAVENOUS | Status: DC

## 2015-06-13 MED ORDER — OXYCODONE HCL 5 MG PO TABS
5.0000 mg | ORAL_TABLET | ORAL | Status: DC | PRN
  Administered 2015-06-16: 5 mg via ORAL
  Filled 2015-06-13: qty 1

## 2015-06-13 MED ORDER — ONDANSETRON HCL 4 MG PO TABS: 4.0000 mg | ORAL_TABLET | Freq: Four times a day (QID) | ORAL | Status: DC | PRN

## 2015-06-13 MED ORDER — DEXTROSE 5 % IV SOLN
2.0000 g | Freq: Two times a day (BID) | INTRAVENOUS | Status: DC
  Administered 2015-06-13 – 2015-06-16 (×7): 2 g via INTRAVENOUS
  Filled 2015-06-13 (×8): qty 2

## 2015-06-13 MED ORDER — PRAVASTATIN SODIUM 10 MG PO TABS
10.0000 mg | ORAL_TABLET | Freq: Every day | ORAL | Status: DC
  Administered 2015-06-13 – 2015-06-17 (×5): 10 mg via ORAL
  Filled 2015-06-13 (×5): qty 1

## 2015-06-13 MED ORDER — HYDROMORPHONE HCL 1 MG/ML IJ SOLN: 0.5000 mg | INTRAMUSCULAR | Status: DC | PRN

## 2015-06-13 MED ORDER — MIRABEGRON ER 50 MG PO TB24
50.0000 mg | ORAL_TABLET | Freq: Every day | ORAL | Status: DC
  Administered 2015-06-13 – 2015-06-17 (×5): 50 mg via ORAL
  Filled 2015-06-13 (×5): qty 1

## 2015-06-13 MED ORDER — LORATADINE 10 MG PO TABS
10.0000 mg | ORAL_TABLET | Freq: Every day | ORAL | Status: DC
  Administered 2015-06-13 – 2015-06-17 (×5): 10 mg via ORAL
  Filled 2015-06-13 (×5): qty 1

## 2015-06-13 MED ORDER — SODIUM CHLORIDE 0.9 % IV SOLN
INTRAVENOUS | Status: DC
  Administered 2015-06-14: 14:00:00 via INTRAVENOUS

## 2015-06-13 MED ORDER — ADULT MULTIVITAMIN W/MINERALS CH
1.0000 | ORAL_TABLET | Freq: Two times a day (BID) | ORAL | Status: DC
Start: 2015-06-13 — End: 2015-06-17
  Administered 2015-06-13 – 2015-06-17 (×8): 1 via ORAL
  Filled 2015-06-13 (×12): qty 1

## 2015-06-13 NOTE — Progress Notes (Signed)
ANTIBIOTIC CONSULT NOTE - INITIAL  Pharmacy Consult for Vancomycin & Ceftazidime Indication: Sepsis  No Known Allergies  Patient Measurements: Height:  (167.6 cm) Weight: 163 lb 9.6 oz (74.208 kg) IBW/kg (Calculated) : 59.3  Vital Signs: Temp: 99.1 F (37.3 C) (09/26 0055) Temp Source: Oral (09/26 0055) BP: 130/57 mmHg (09/26 0055) Pulse Rate: 86 (09/26 0055) Intake/Output from previous day:   Intake/Output from this shift:    Labs:  Recent Labs  06/12/15 2052  WBC 11.6*  HGB 9.6*  PLT 319  CREATININE 0.91   Estimated Creatinine Clearance: 49.1 mL/min (by C-G formula based on Cr of 0.91). No results for input(s): VANCOTROUGH, VANCOPEAK, VANCORANDOM, GENTTROUGH, GENTPEAK, GENTRANDOM, TOBRATROUGH, TOBRAPEAK, TOBRARND, AMIKACINPEAK, AMIKACINTROU, AMIKACIN in the last 72 hours.   Microbiology: No results found for this or any previous visit (from the past 720 hour(s)).  Medical History: Past Medical History  Diagnosis Date  . Urine, incontinence, stress female   . GERD (gastroesophageal reflux disease)   . Arthritis     right leg  . Hyperlipidemia   . Anemia   . Depression     Medications:  Scheduled:  . cefTAZidime (FORTAZ)  IV  2 g Intravenous Q12H  . citalopram  10 mg Oral Daily  . enoxaparin (LOVENOX) injection  40 mg Subcutaneous Q24H  . loratadine  10 mg Oral Daily  . mirabegron ER  50 mg Oral Daily  . multivitamin with minerals  1 tablet Oral BID  . pantoprazole  40 mg Oral Daily  . pravastatin  10 mg Oral Daily  . sodium chloride  1,000 mL Intravenous Q1H   Followed by  . sodium chloride  500 mL Intravenous Q1H   Infusions:  . sodium chloride     Assessment:  81 yr female with fever, chills, nausea  Recent hospitalization 3 weeks ago for AMS and UTI  Patient received Vancomycin 1gm and Ceftazidime 2gm IV x 1 in ED  Upon admission, pharmacy consulted to dose vancomycin and ceftazidime for sepsis  CrCl = 49 ml/min  9/24 >>vanc  >> 9/24 >>ceftazidime >>    9/24 blood: 9/24 urine:  Goal of Therapy:  Vancomycin trough level 15-20 mcg/ml  Plan:  Measure antibiotic drug levels at steady state Follow up culture results  Ceftazidime 2gm IV q12h   Vancomycin  IV q12h  Maryellen Pile, PharmD 06/13/2015,2:07 AM

## 2015-06-13 NOTE — Progress Notes (Signed)
Nutrition Brief Note  Patient identified on the Malnutrition Screening Tool (MST) Report  Wt Readings from Last 15 Encounters:  06/13/15 163 lb 9.6 oz (74.208 kg)  05/17/15 163 lb 8 oz (74.163 kg)  10/14/13 157 lb 3 oz (71.3 kg)  07/07/13 154 lb (69.854 kg)    Body mass index is 26.42 kg/(m^2). Patient meets criteria for overweight based on current BMI.   Pt seen for MST. She reports she had a bagel for breakfast and that she is planning to order lunch later before taking medications. Pt reports that some medications make her nauseated so she needs to eat something prior to taking them. She states she was awake most of the night and is not feeling as hungry today due to feeling tired.   Pt reports she had a good appetite with no recent weight changes PTA. Limited weight hx available. She states that she was recently admitted due to low iron and that this continues. Asked pt if it would be helpful to have a list of foods high in iron but she declines stating "that is thoughtful, but I think I already know which foods are good sources of iron." Pt requested ginger ale which RD provided for her.   Current diet order is Heart Healthy, patient is consuming approximately 100% of meals at this time. Labs and medications reviewed.   No nutrition interventions warranted at this time. If nutrition issues arise, please consult RD.      Trenton Gammon, RD, LDN Inpatient Clinical Dietitian Pager # [phone] After hours/weekend pager # [phone]

## 2015-06-13 NOTE — Progress Notes (Signed)
[facility] PROGRESS NOTE  Chloe Wong MRN:[id number] DOB: 07/01/1931 DOA: 06/12/2015 PCP: STONEKING,HAL THOMAS, MD  Assessment/Plan: 1-SIRS/sepsis due to UTI:  -still spiking high grade fever -continue IV antibiotics -WBC's trending down -no longer tachycardic -follow culture data  2-gram neg rods bacteremia: -cover by current antibiotics therapy -will follow speciation and sensitivity -continue IV antibiotics -PRN antipyretics  3-HLD: will continue statins  4-GERD: will continue PPI  5-depression: continue celexa  6-Anemia: with recent admission where she received 2 units of PRBC's -Hgb 8.3 currently -no signs of overt bleeding -will start niferex -follow Hgb trend -appears to be secondary to iron deficiency anemia/?? Myelodysplasia component   Code Status: Full Family Communication: no family at bedside Disposition Plan: remains inpatient; continue IV antibiotics, follow cx data   Consultants:  None   Procedures:  See below for x-ray reports   Antibiotics:  Vanc 9/25  Fortaz 9/25  HPI/Subjective: Denies CP, SOB and abd pain. Patient still with grade temp overnight.   Objective: Filed Vitals:   06/13/15 1326  BP: 95/78  Pulse: 86  Temp: 98.1 F (36.7 C)  Resp: 22    Intake/Output Summary (Last 24 hours) at 06/13/15 1520 Last data filed at 06/13/15 1327  Gross per 24 hour  Intake    480 ml  Output      0 ml  Net    480 ml   Filed Weights   06/12/15 2030 06/12/15 2139 06/13/15 0055  Weight: 74.39 kg (164 lb) 73.483 kg (162 lb) 74.208 kg (163 lb 9.6 oz)    Exam:   General:  Spiking high grade fever overnight, feeling somewhat better, denies CP or SOB  Cardiovascular: S1 and S2, no rubs or gallops  Respiratory: good air movement, no wheezing  Abdomen: soft, NT, ND, positive BS  Musculoskeletal: no edema, no cyanosis or clubbing   Data Reviewed: Basic Metabolic Panel:  Recent Labs Lab 06/12/15 2052 06/13/15 0210   NA 136 139  K 3.8 3.7  CL 105 110  CO2 23 23  GLUCOSE 123* 113*  BUN 11 10  CREATININE 0.91 0.79  CALCIUM 9.0 8.5*   CBC:  Recent Labs Lab 06/12/15 2052 06/13/15 0210  WBC 11.6* 10.6*  NEUTROABS 9.8*  --   HGB 9.6* 8.3*  HCT 31.8* 27.5*  MCV 73.1* 73.7*  PLT 319 266    ProBNP (last 3 results) No results for input(s): PROBNP in the last 8760 hours.  CBG: No results for input(s): GLUCAP in the last 168 hours.  Recent Results (from the past 240 hour(s))  Blood Culture (routine x 2)     Status: None (Preliminary result)   Collection Time: 06/12/15  9:00 PM  Result Value Ref Range Status   Specimen Description BLOOD RIGHT ANTECUBITAL  Final   Special Requests BOTTLES DRAWN AEROBIC AND ANAEROBIC <MEASUREMENTKentucky>[phone] MedicalFreida Bu [phone] HospitalFreida Bu sKentuckym973-59[place] MFrei dKentuckya8022[facility]haviFreida BKentuckyu720-47[facility] CFreida Bu [phone] Freida Bu sKentuckym228 04[facility] VeteFreida Bu sKentuckym218-40[place] [facility] MFreida [phone] HospFreida Bu sKentuckym2625Western SFreid aK[place]ucky [phone] MFreida Bu [phone] MFreida BKentuckyu670-16[facility] Freida BKentuckyu409-80[facility] HosFreid aKentucky [phone] SFreida BKentuckyu651-56[facility] HealtFreida Bu [phone] SFreida Bu sKentuckym848-44[place] LFreida Bu [phone] SurgeFreida Bu sKentuckym702-7Freida [phone] CenterFreida Bu [phone] RFreida BKentuckyu360-66[facility] RehabilitaFr eKentuckyi720-76[facility] Freida Bu [phone] SurgicFreida Busmanrneda Skilla SkillllllllkillllllkillllllSkillture  Setup Time   Final    GRAM NEGATIVE RODS CRITICAL RESULT CALLED TO, READ BACK BY AND VERIFIED WITH: E. Caudle RN 13:05 06/13/15 ([email or web address]) Performed at [facility]    Culture PENDING  Incomplete   Report Status PENDING  Incomplete  Blood Culture (routine x 2)     Status: None (Preliminary result)   Collection Time: 06/12/15 10:10 PM  Result Value Ref Range Status   Specimen Description BLOOD LEFT ANTECUBITAL  Final   Special RequestKorea BKorea ONutritional therapist GRAM  NEGATIVE RODS CRITICAL RESULT CALLED TO, READ BACK BY AND VERIFIED WITH: Kathleen Lime RN 13:05 06/13/15 ([email or web address]) IN BOTH AEROBIC AND ANAEROBIC BOTTLES    Culture   Final    GRAM NEGATIVE RODS Performed at [facility]    Report Status PENDING  Incomplete     Studies: Dg Chest 2 View  06/12/2015   CLINICAL DATA:  Dizziness with cough and congestion, fever  EXAM: CHEST - 2 VIEW  COMPARISON:  05/19/2015  FINDINGS: Cardiac shadow is within normal limits. A large hiatal hernia is again identified. No focal infiltrate or sizable effusion is seen. No bony abnormality is noted.  IMPRESSION: No acute abnormality seen.  Stable hiatal  hernia.   Electronically Signed   By: Alcide Clever M.D.   On: 06/12/2015 21:46    Scheduled Meds: . cefTAZidime (FORTAZ)  IV  2 g Intravenous Q12H  . citalopram  10 mg Oral Daily  . enoxaparin (LOVENOX) injection  40 mg Subcutaneous Q24H  . loratadine  10 mg Oral Daily  . mirabegron ER  50 mg Oral Daily  . multivitamin with minerals  1 tablet Oral BID  . pantoprazole  40 mg Oral Daily  . pravastatin  10 mg Oral Daily  . vancomycin  750 mg Intravenous Q12H   Continuous Infusions: . sodium chloride      Principal Problem:   Sepsis Active Problems:   UTI (lower urinary tract infection)   HTN (hypertension)   Anemia    Time spent: 30 minutes    Vassie Loll  [facility] Pager [phone]. If 7PM-7AM, please contact night-coverage at [email or web address], password [facility] 06/13/2015, 3:20 PM  LOS: 1 day

## 2015-06-14 DIAGNOSIS — E872 Acidosis: Secondary | ICD-10-CM

## 2015-06-14 MED ORDER — POLYSACCHARIDE IRON COMPLEX 150 MG PO CAPS
150.0000 mg | ORAL_CAPSULE | Freq: Every day | ORAL | Status: DC
  Administered 2015-06-14 – 2015-06-17 (×4): 150 mg via ORAL
  Filled 2015-06-14 (×4): qty 1

## 2015-06-14 NOTE — Progress Notes (Signed)
[facility] PROGRESS NOTE  Chloe Wong ZOX:[id number] DOB: Aug 11, 1931 DOA: 06/12/2015 PCP: Ginette Otto, MD  Assessment/Plan: 1-SIRS/sepsis due to E. Coli UTI:  -afebrile now -lactic acid WNL -WBC's trending down and almost WNL -continue IV antibiotics; will d/c vancomycin  -follow culture data for sensitivity   2-E. coli bacteremia: -cover by current antibiotics therapy -will follow sensitivity -continue IV antibiotics (per ID curbside will need IV treatment for 5 days; then discharge on PO antibiotics and complete a total of 12 days) -continue PRN antipyretics  3-HLD: will continue statins  4-GERD: will continue PPI  5-depression: continue celexa  6-Iron deficiency anemia: with recent admission where she received 2 units of PRBC's -Hgb 8.3 currently -no signs of overt bleeding -will continue niferex -follow Hgb trend -recent work up demonstrated ferritin of 6, suggesting to be secondary to iron deficiency anemia/?? Myelodysplasia component   Code Status: Full Family Communication: no family at bedside Disposition Plan: remains inpatient; continue IV antibiotics, follow cx data   Consultants:  None   Procedures:  See below for x-ray reports   Antibiotics:  Vanc 9/25>>9/27  Elita Quick 9/25  HPI/Subjective: Denies CP, SOB and abd pain. Patient feeling better and currently denying nausea/vomiting. Afebrile.  Objective: Filed Vitals:   06/14/15 1000  BP: 127/68  Pulse: 82  Temp: 98.2 F (36.8 C)  Resp: 20    Intake/Output Summary (Last 24 hours) at 06/14/15 1216 Last data filed at 06/14/15 0840  Gross per 24 hour  Intake    480 ml  Output    500 ml  Net    -20 ml   Filed Weights   06/12/15 2030 06/12/15 2139 06/13/15 0055  Weight: 74.39 kg (164 lb) 73.483 kg (162 lb) 74.208 kg (163 lb 9.6 oz)    Exam:   General:  Afebrile, no CP, no SOB. Feeling much better; denies CP or SOB  Cardiovascular: S1 and S2, no rubs or  gallops  Respiratory: good air movement, no wheezing  Abdomen: soft, NT, ND, positive BS  Musculoskeletal: no edema, no cyanosis or clubbing   Data Reviewed: Basic Metabolic Panel:  Recent Labs Lab 06/12/15 2052 06/13/15 0210  NA 136 139  K 3.8 3.7  CL 105 110  CO2 23 23  GLUCOSE 123* 113*  BUN 11 10  CREATININE 0.91 0.79  CALCIUM 9.0 8.5*   CBC:  Recent Labs Lab 06/12/15 2052 06/13/15 0210  WBC 11.6* 10.6*  NEUTROABS 9.8*  --   HGB 9.6* 8.3*  HCT 31.8* 27.5*  MCV 73.1* 73.7*  PLT 319 266   CBG: No results for input(s): GLUCAP in the last 168 hours.  Recent Results (from the past 240 hour(s))  Blood Culture (routine x 2)     Status: None (Preliminary result)   Collection Time: 06/12/15  9:00 PM  Result Value Ref Range Status   Specimen Description BLOOD RIGHT ANTECUBITAL  Final   Special Requests BOTTLES DRAWN AEROBIC AND ANAEROBIC  Final   Culture  Setup Time   Final    GRAM NEGATIVE RODS IN BOTH AEROBIC AND ANAEROBIC BOTTLES CRITICAL RESULT CALLED TO, READ BACK BY AND VERIFIED WITH: Kathleen Lime RN 13:05 06/13/15 ([email or web address])    Culture   Final    ESCHERICHIA COLI Performed at [facility]    Report Status PENDING  Incomplete  Blood Culture (routine x 2)     Status: None (Preliminary result)   Collection Time: 06/12/15 10:10 PM  Result Value Ref Range Status  Specimen Description BLOOD LEFT ANTECUBITAL  Final   Special Requests BOTTLES DRAWN AEROBIC AND ANAEROBIC  Final   Culture  Setup Time   Final    GRAM NEGATIVE RODS CRITICAL RESULT CALLED TO, READ BACK BY AND VERIFIED WITH: Kathleen Lime RN 13:05 06/13/15 ([email or web address]) IN BOTH AEROBIC AND ANAEROBIC BOTTLES    Culture   Final    ESCHERICHIA COLI Performed at [facility]    Report Status PENDING  Incomplete  Urine culture     Status: None (Preliminary result)   Collection Time: 06/12/15 10:17 PM  Result Value Ref Range Status   Specimen Description URINE, CATHETERIZED  Final    Special Requests NONE  Final   Culture   Final    >=100,000 COLONIES/mL ESCHERICHIA COLI Performed at [facility]    Report Status PENDING  Incomplete     Studies: Dg Chest 2 View  06/12/2015   CLINICAL DATA:  Dizziness with cough and congestion, fever  EXAM: CHEST - 2 VIEW  COMPARISON:  05/19/2015  FINDINGS: Cardiac shadow is within normal limits. A large hiatal hernia is again identified. No focal infiltrate or sizable effusion is seen. No bony abnormality is noted.  IMPRESSION: No acute abnormality seen.  Stable hiatal hernia.   Electronically Signed   By: Alcide Clever M.D.   On: 06/12/2015 21:46    Scheduled Meds: . cefTAZidime (FORTAZ)  IV  2 g Intravenous Q12H  . citalopram  10 mg Oral Daily  . enoxaparin (LOVENOX) injection  40 mg Subcutaneous Q24H  . loratadine  10 mg Oral Daily  . mirabegron ER  50 mg Oral Daily  . multivitamin with minerals  1 tablet Oral BID  . pantoprazole  40 mg Oral Daily  . pravastatin  10 mg Oral Daily   Continuous Infusions: . sodium chloride      Principal Problem:   Sepsis Active Problems:   UTI (lower urinary tract infection)   HTN (hypertension)   Anemia    Time spent: 30 minutes    Vassie Loll  [facility] Pager [phone]. If 7PM-7AM, please contact night-coverage at [email or web address], password [facility] 06/14/2015, 12:16 PM  LOS: 2 days

## 2015-06-15 DIAGNOSIS — A4151 Sepsis due to Escherichia coli [E. coli]: Principal | ICD-10-CM

## 2015-06-15 DIAGNOSIS — N390 Urinary tract infection, site not specified: Secondary | ICD-10-CM

## 2015-06-15 DIAGNOSIS — I10 Essential (primary) hypertension: Secondary | ICD-10-CM

## 2015-06-15 LAB — CBC
HEMATOCRIT: 29.5 % — AB (ref 36.0–46.0)
HEMOGLOBIN: 8.7 g/dL — AB (ref 12.0–15.0)
MCH: 21.8 pg — ABNORMAL LOW (ref 26.0–34.0)
MCHC: 29.5 g/dL — ABNORMAL LOW (ref 30.0–36.0)
MCV: 73.8 fL — ABNORMAL LOW (ref 78.0–100.0)
Platelets: ADEQUATE 10*3/uL (ref 150–400)
RBC: 4 MIL/uL (ref 3.87–5.11)
RDW: 24.8 % — AB (ref 11.5–15.5)
WBC: 8.1 10*3/uL (ref 4.0–10.5)

## 2015-06-15 LAB — BASIC METABOLIC PANEL
ANION GAP: 6 (ref 5–15)
BUN: <5 mg/dL — ABNORMAL LOW (ref 6–20)
CALCIUM: 8.6 mg/dL — AB (ref 8.9–10.3)
CHLORIDE: 110 mmol/L (ref 101–111)
CO2: 26 mmol/L (ref 22–32)
CREATININE: 0.71 mg/dL (ref 0.44–1.00)
GFR calc non Af Amer: >60 mL/min (ref >60)
Glucose, Bld: 94 mg/dL (ref 65–99)
Potassium: 3.3 mmol/L — ABNORMAL LOW (ref 3.5–5.1)
Sodium: 142 mmol/L (ref 135–145)

## 2015-06-15 LAB — CULTURE, BLOOD (ROUTINE X 2)

## 2015-06-15 LAB — URINE CULTURE

## 2015-06-15 MED ORDER — POTASSIUM CHLORIDE CRYS ER 20 MEQ PO TBCR
40.0000 meq | EXTENDED_RELEASE_TABLET | Freq: Once | ORAL | Status: AC
  Administered 2015-06-15: 40 meq via ORAL
  Filled 2015-06-15: qty 2

## 2015-06-15 NOTE — Progress Notes (Signed)
[facility] PROGRESS NOTE  Chloe Wong ZOX:[id number] DOB: Jan 29, 1931 DOA: 06/12/2015 PCP: Ginette Otto, MD  Assessment/Plan: 1-SIRS/sepsis due to E. Coli UTI:  -lactic acid WNL -WBC's trending down and almost WNL -continue IV antibiotics; will d/c vancomycin   2-E. coli bacteremia: -cover by current antibiotics therapy -continue IV antibiotics (per ID curbside will need IV treatment for 5 days; then discharge on PO antibiotics and complete Wong total of 12 days) -day 3 IV antibiotics.   3-HLD: will continue statins  4-GERD: will continue PPI  5-depression: continue celexa  6-Iron deficiency anemia: with recent admission where she received 2 units of PRBC's -Hgb 8.7 currently -no signs of overt bleeding -will continue niferex -follow Hgb trend -recent work up demonstrated ferritin of 6, suggesting to be secondary to iron deficiency anemia/.  -further evaluation by PCP.   Code Status: Full Family Communication: no family at bedside Disposition Plan: remains inpatient; continue IV antibiotics, follow cx data   Consultants:  None   Procedures:  See below for x-ray reports   Antibiotics:  Vanc 9/25>>9/27  Elita Quick 9/25  HPI/Subjective: Denies dysuria. Feeling ok.   Objective: Filed Vitals:   06/15/15 0408  BP: 151/78  Pulse: 81  Temp: 98.8 F (37.1 C)  Resp: 18    Intake/Output Summary (Last 24 hours) at 06/15/15 1217 Last data filed at 06/15/15 0501  Gross per 24 hour  Intake 1116.67 ml  Output    800 ml  Net 316.67 ml   Filed Weights   06/12/15 2030 06/12/15 2139 06/13/15 0055  Weight: 74.39 kg (164 lb) 73.483 kg (162 lb) 74.208 kg (163 lb 9.6 oz)    Exam:   General: NAD  Cardiovascular: S1 and S2, no rubs or gallops  Respiratory: good air movement, no wheezing  Abdomen: soft, NT, ND, positive BS  Musculoskeletal: no edema.   Data Reviewed: Basic Metabolic Panel:  Recent Labs Lab 06/12/15 2052 06/13/15 0210  06/15/15 0550  NA 136 139 142  K 3.8 3.7 3.3*  CL 105 110 110  CO2 GLUCOSE 123* 113* 94  BUN 11 10 <5*  CREATININE 0.91 0.79 0.71  CALCIUM 9.0 8.5* 8.6*   CBC:  Recent Labs Lab 06/12/15 2052 06/13/15 0210 06/15/15 0550  WBC 11.6* 10.6* 8.1  NEUTROABS 9.8*  --   --   HGB 9.6* 8.3* 8.7*  HCT 31.8* 27.5* 29.5*  MCV 73.1* 73.7* 73.8*  PLT 319 266 PLATELET CLUMPS NOTED ON SMEAR, COUNT APPEARS ADEQUATE   CBG: No results for input(s): GLUCAP in the last 168 hours.  Recent Results (from the past 240 hour(s))  Blood Culture (routine x 2)     Status: None   Collection Time: 06/12/15  9:00 PM  Result Value Ref Range Status   Specimen Description BLOOD RIGHT ANTECUBITAL  Final   Special Requests BOTTLES DRAWN AEROBIC AND ANAEROBIC  Final   Culture  Setup Time   Final    GRAM NEGATIVE RODS IN BOTH AEROBIC AND ANAEROBIC BOTTLES CRITICAL RESULT CALLED TO, READ BACK BY AND VERIFIED WITH: Kathleen Lime RN 13:05 06/13/15 ([email or web address])    Culture   Final    ESCHERICHIA COLI Performed at [facility]    Report Status 06/15/2015 FINAL  Final   Organism ID, Bacteria ESCHERICHIA COLI  Final      Susceptibility   Escherichia coli - MIC*    AMPICILLIN 4 SENSITIVE Sensitive     CEFAZOLIN <=4 SENSITIVE Sensitive  CEFEPIME <=1 SENSITIVE Sensitive     CEFTAZIDIME <=1 SENSITIVE Sensitive     CEFTRIAXONE <=1 SENSITIVE Sensitive     CIPROFLOXACIN <=0.25 SENSITIVE Sensitive     GENTAMICIN <=1 SENSITIVE Sensitive     IMIPENEM <=0.25 SENSITIVE Sensitive     TRIMETH/SULFA <=20 SENSITIVE Sensitive     AMPICILLIN/SULBACTAM <=2 SENSITIVE Sensitive     PIP/TAZO <=4 SENSITIVE Sensitive     * ESCHERICHIA COLI  Blood Culture (routine x 2)     Status: None   Collection Time: 06/12/15 10:10 PM  Result Value Ref Range Status   Specimen Description BLOOD LEFT ANTECUBITAL  Final   Special Requests BOTTLES DRAWN AEROBIC AND ANAEROBIC  Final   Culture  Setup Time   Final    GRAM  NEGATIVE RODS CRITICAL RESULT CALLED TO, READ BACK BY AND VERIFIED WITH: Kathleen Lime RN 13:05 06/13/15 ([email or web address]) IN BOTH AEROBIC AND ANAEROBIC BOTTLES    Culture   Final    ESCHERICHIA COLI Performed at [facility]    Report Status 06/15/2015 FINAL  Final  Urine culture     Status: None   Collection Time: 06/12/15 10:17 PM  Result Value Ref Range Status   Specimen Description URINE, CATHETERIZED  Final   Special Requests NONE  Final   Culture   Final    >=100,000 COLONIES/mL ESCHERICHIA COLI Performed at [facility]    Report Status 06/15/2015 FINAL  Final   Organism ID, Bacteria ESCHERICHIA COLI  Final      Susceptibility   Escherichia coli - MIC*    AMPICILLIN 8 SENSITIVE Sensitive     CEFAZOLIN <=4 SENSITIVE Sensitive     CEFTRIAXONE <=1 SENSITIVE Sensitive     CIPROFLOXACIN <=0.25 SENSITIVE Sensitive     GENTAMICIN <=1 SENSITIVE Sensitive     IMIPENEM <=0.25 SENSITIVE Sensitive     NITROFURANTOIN 32 SENSITIVE Sensitive     TRIMETH/SULFA <=20 SENSITIVE Sensitive     AMPICILLIN/SULBACTAM 4 SENSITIVE Sensitive     PIP/TAZO <=4 SENSITIVE Sensitive     * >=100,000 COLONIES/mL ESCHERICHIA COLI     Studies: No results found.  Scheduled Meds: . cefTAZidime (FORTAZ)  IV  2 g Intravenous Q12H  . citalopram  10 mg Oral Daily  . enoxaparin (LOVENOX) injection  40 mg Subcutaneous Q24H  . iron polysaccharides  150 mg Oral Daily  . loratadine  10 mg Oral Daily  . mirabegron ER  50 mg Oral Daily  . multivitamin with minerals  1 tablet Oral BID  . pantoprazole  40 mg Oral Daily  . potassium chloride  40 mEq Oral Once  . pravastatin  10 mg Oral Daily   Continuous Infusions:    Principal Problem:   Sepsis Active Problems:   UTI (lower urinary tract infection)   HTN (hypertension)   Anemia    Time spent: 30 minutes    Chloe Wong  [facility] Pager [phone]. If 7PM-7AM, please contact night-coverage at [email or web address], password  [facility] 06/15/2015, 12:17 PM  LOS: 3 days

## 2015-06-15 NOTE — Progress Notes (Signed)
CSW received consult regarding patient being from a facility. Patient is not from a facility and resides at [facility] within the independently living residences. Patient reports plan to return back to [facility] upon being medically stable and states that her son Chloe Wong [phone]) will be picking her up when ready for discharge. No other concerns verbalized by patient.    Chloe Colonel, LCSW Clinical Social Work [facility]  [phone]

## 2015-06-15 NOTE — Evaluation (Signed)
Physical Therapy Evaluation Patient Details Name: Chloe Wong MRN: [id number] DOB: 1930/09/30 Today's Date: 06/15/2015   History of Present Illness  81 yo female admitted with sepsis, UTI. Hx of HTN, anemia, arthritis. Pt is from [facility]  Clinical Impression  On eval, pt was Min guard assist for mobility-walked ~400 feet. Slightly unsteady and times but no overt LOB. Pt has been walking some with nursing. Will keep on caseload to continue to assess and mobilize pt. Do not anticipate any follow up PT needs however pt could benefit from home safety evaluation.     Follow Up Recommendations No PT follow up (Home Safety Evaluation)    Equipment Recommendations  None recommended by PT    Recommendations for Other Services       Precautions / Restrictions Precautions Precautions: None Restrictions Weight Bearing Restrictions: No      Mobility  Bed Mobility Overal bed mobility: Modified Independent                Transfers Overall transfer level: Modified independent                  Ambulation/Gait Ambulation/Gait assistance: Min guard Ambulation Distance (Feet): 400 Feet Assistive device: None Gait Pattern/deviations: Step-through pattern;Decreased stride length;Drifts right/left;Staggering left     General Gait Details: slower gait speed. close guard for safety. No overt LOB but intermittent unsteadines noted.   Stairs            Wheelchair Mobility    Modified Rankin (Stroke Patients Only)       Balance Overall balance assessment: Needs assistance           Standing balance-Leahy Scale: Good Standing balance comment: Had pt perfrom static standing with EO, EC, narrow BOS, withstanding external perturbations. close guard for safety             High level balance activites: Side stepping;Backward walking;Direction changes;Turns;Sudden stops;Head turns High Level Balance Comments: close guard for safety. Increased time to  complete tasks.              Pertinent Vitals/Pain Pain Assessment: No/denies pain    Home Living Family/patient expects to be discharged to:: Private residence Living Arrangements: Alone   Type of Home: Independent living facility Home Access: Level entry;Elevator     Home Layout: One level Home Equipment: None      Prior Function Level of Independence: Independent               Hand Dominance        Extremity/Trunk Assessment   Upper Extremity Assessment: Overall WFL for tasks assessed           Lower Extremity Assessment: Generalized weakness      Cervical / Trunk Assessment: Normal  Communication   Communication: No difficulties  Cognition Arousal/Alertness: Awake/alert Behavior During Therapy: WFL for tasks assessed/performed Overall Cognitive Status: Within Functional Limits for tasks assessed                      General Comments      Exercises        Assessment/Plan    PT Assessment Patient needs continued PT services  PT Diagnosis Difficulty walking;Generalized weakness   PT Problem List Decreased mobility  PT Treatment Interventions Gait training;Functional mobility training;Therapeutic activities;Patient/family education;Therapeutic exercise;Balance training   PT Goals (Current goals can be found in the Care Plan section) Acute Rehab PT Goals Patient Stated Goal: home soon PT Goal Formulation:  With patient Time For Goal Achievement: 06/29/15 Potential to Achieve Goals: Good    Frequency Min 3X/week   Barriers to discharge        Co-evaluation               End of Session Equipment Utilized During Treatment: Gait belt Activity Tolerance: Patient tolerated treatment well Patient left: in bed;with call bell/phone within reach           Time: 1451-1509 PT Time Calculation (min) (ACUTE ONLY): 18 min   Charges:   PT Evaluation $Initial PT Evaluation Tier I: 1 Procedure     PT G Codes:         Rebeca Alert, MPT Pager: [phone]

## 2015-06-16 LAB — BASIC METABOLIC PANEL
ANION GAP: 6 (ref 5–15)
BUN: 7 mg/dL (ref 6–20)
CALCIUM: 8.7 mg/dL — AB (ref 8.9–10.3)
CO2: 25 mmol/L (ref 22–32)
Chloride: 111 mmol/L (ref 101–111)
Creatinine, Ser: 0.75 mg/dL (ref 0.44–1.00)
Glucose, Bld: 94 mg/dL (ref 65–99)
Potassium: 3.7 mmol/L (ref 3.5–5.1)
SODIUM: 142 mmol/L (ref 135–145)

## 2015-06-16 MED ORDER — POLYETHYLENE GLYCOL 3350 17 G PO PACK
17.0000 g | PACK | Freq: Every day | ORAL | Status: DC
  Administered 2015-06-16 – 2015-06-17 (×2): 17 g via ORAL
  Filled 2015-06-16 (×2): qty 1

## 2015-06-16 MED ORDER — DEXTROSE 5 % IV SOLN
2.0000 g | Freq: Three times a day (TID) | INTRAVENOUS | Status: DC
  Administered 2015-06-16 – 2015-06-17 (×3): 2 g via INTRAVENOUS
  Filled 2015-06-16 (×4): qty 2

## 2015-06-16 NOTE — Progress Notes (Signed)
Physical Therapy Treatment Patient Details Name: Chloe Wong MRN: [id number] DOB: October 31, 1930 Today's Date: 06/16/2015    History of Present Illness 81 yo female admitted with sepsis, UTI. Hx of HTN, anemia, arthritis. Pt is from [facility]    PT Comments    Pt continues to participate well. Noted some fatigue and increased instability noted during session on today. No overt LOB. Encouraged pt to mobilize as much as possible. Pt would benefit from ambulation in hallway with nursing during hospital stay. Pt states her son will help her at home some for a couple of days.  Follow Up Recommendations  No PT follow up (home safety evaluation)     Equipment Recommendations  None recommended by PT    Recommendations for Other Services       Precautions / Restrictions Precautions Precautions: Fall Restrictions Weight Bearing Restrictions: No    Mobility  Bed Mobility               General bed mobility comments: pt oob in recliner  Transfers Overall transfer level: Modified independent               General transfer comment: Increased time  Ambulation/Gait Ambulation/Gait assistance: Min guard Ambulation Distance (Feet): 400 Feet (400'x1, 200'x1) Assistive device: None Gait Pattern/deviations: Decreased stride length;Step-through pattern;Drifts right/left;Staggering right;Staggering left     General Gait Details: slower gait speed. close guard for safety. No overt LOB but intermittent unsteadines noted. Seated rest breaks between walks   Stairs            Wheelchair Mobility    Modified Rankin (Stroke Patients Only)       Balance           Standing balance support: During functional activity Standing balance-Leahy Scale: Good                      Cognition Arousal/Alertness: Awake/Wong Behavior During Therapy: WFL for tasks assessed/performed Overall Cognitive Status: Within Functional Limits for tasks assessed                       Exercises      General Comments        Pertinent Vitals/Pain Pain Assessment: No/denies pain    Home Living                      Prior Function            PT Goals (current goals can now be found in the care plan section) Progress towards PT goals: Progressing toward goals    Frequency  Min 3X/week    PT Plan Current plan remains appropriate    Co-evaluation             End of Session Equipment Utilized During Treatment: Gait belt Activity Tolerance: Patient tolerated treatment well Patient left: in chair;with call bell/phone within reach     Time: 3295-1884 PT Time Calculation (min) (ACUTE ONLY): 19 min  Charges:  $Gait Training: 8-22 mins                    G Codes:      Chloe Wong, MPT Pager: [phone]

## 2015-06-16 NOTE — Care Management Note (Signed)
Case Management Note  Patient Details  Name: Chloe Wong MRN: [id number] Date of Birth: 1931/04/14  Subjective/Objective: PT-recc HHRN safety eval. Spoke to [facility] liason-[facility]ia-they use their own HHC-please fax HHRN order,[id number] to fax#[phone], & any med changes or send med changes to patient's pharmacy.MD/Nsg notified.                   Action/Plan:d/c home w/HHC.   Expected Discharge Date:   (UNKNOWN)               Expected Discharge Plan:  Home w Home Health Services  In-House Referral:     Discharge planning Services  CM Consult  Post Acute Care Choice:    Choice offered to:  Patient  DME Arranged:    DME Agency:     HH Arranged:    HH Agency:   ([facility]-use their own HHC agency-[facility] @ home.)  Status of Service:  In process, will continue to follow  Medicare Important Message Given:    Date Medicare IM Given:    Medicare IM give by:    Date Additional Medicare IM Given:    Additional Medicare Important Message give by:     If discussed at Long Length of Stay Meetings, dates discussed:    Additional Comments:  Lanier Clam, RN 06/16/2015, 2:54 PM

## 2015-06-16 NOTE — Progress Notes (Addendum)
ANTIBIOTIC CONSULT NOTE - FOLLOW UP  Pharmacy Consult for Ceftazidime Indication: sepsis d/t bacteremia/UTI  No Known Allergies  Patient Measurements: Height:  (167.6 cm) Weight: 163 lb 9.6 oz (74.208 kg) IBW/kg (Calculated) : 59.3  Vital Signs: Temp: 98.2 F (36.8 C) (09/29 1400) Temp Source: Oral (09/29 1400) BP: 136/68 mmHg (09/29 1400) Pulse Rate: 76 (09/29 0830) Intake/Output from previous day: 09/28 0701 - 09/29 0700 In: 940 [P.O.:840; IV Piggyback:100] Out: -  Intake/Output from this shift: Total I/O In: 240 [P.O.:240] Out: -   Labs:  Recent Labs  06/15/15 0550 06/16/15 0505  WBC 8.1  --   HGB 8.7*  --   PLT PLATELET CLUMPS NOTED ON SMEAR, COUNT APPEARS ADEQUATE  --   CREATININE 0.71 0.75   Estimated Creatinine Clearance: 55.9 mL/min (by C-G formula based on Cr of 0.75). No results for input(s): VANCOTROUGH, VANCOPEAK, VANCORANDOM, GENTTROUGH, GENTPEAK, GENTRANDOM, TOBRATROUGH, TOBRAPEAK, TOBRARND, AMIKACINPEAK, AMIKACINTROU, AMIKACIN in the last 72 hours.   Microbiology: Recent Results (from the past 720 hour(s))  Blood Culture (routine x 2)     Status: None   Collection Time: 06/12/15  9:00 PM  Result Value Ref Range Status   Specimen Description BLOOD RIGHT ANTECUBITAL  Final   Special Requests BOTTLES DRAWN AEROBIC AND ANAEROBIC  Final   Culture  Setup Time   Final    GRAM NEGATIVE RODS IN BOTH AEROBIC AND ANAEROBIC BOTTLES CRITICAL RESULT CALLED TO, READ BACK BY AND VERIFIED WITH: EHaywood Lasso RN 13:05 06/13/15 ([email or web address])    Culture   Final    ESCHERICHIA COLI Performed at [facility]    Report Status 06/15/2015 FINAL  Final   Organism ID, Bacteria ESCHERICHIA COLI  Final      Susceptibility   Escherichia coli - MIC*    AMPICILLIN 4 SENSITIVE Sensitive     CEFAZOLIN <=4 SENSITIVE Sensitive     CEFEPIME <=1 SENSITIVE Sensitive     CEFTAZIDIME <=1 SENSITIVE Sensitive     CEFTRIAXONE <=1 SENSITIVE Sensitive     CIPROFLOXACIN <=0.25  SENSITIVE Sensitive     GENTAMICIN <=1 SENSITIVE Sensitive     IMIPENEM <=0.25 SENSITIVE Sensitive     TRIMETH/SULFA <=20 SENSITIVE Sensitive     AMPICILLIN/SULBACTAM <=2 SENSITIVE Sensitive     PIP/TAZO <=4 SENSITIVE Sensitive     * ESCHERICHIA COLI  Blood Culture (routine x 2)     Status: None   Collection Time: 06/12/15 10:10 PM  Result Value Ref Range Status   Specimen Description BLOOD LEFT ANTECUBITAL  Final   Special Requests BOTTLES DRAWN AEROBIC AND ANAEROBIC  Final   Culture  Setup Time   Final    GRAM NEGATIVE RODS CRITICAL RESULT CALLED TO, READ BACK BY AND VERIFIED WITH: Kathleen Lime RN 13:05 06/13/15 ([email or web address]) IN BOTH AEROBIC AND ANAEROBIC BOTTLES    Culture   Final    ESCHERICHIA COLI Performed at [facility]    Report Status 06/15/2015 FINAL  Final  Urine culture     Status: None   Collection Time: 06/12/15 10:17 PM  Result Value Ref Range Status   Specimen Description URINE, CATHETERIZED  Final   Special Requests NONE  Final   Culture   Final    >=100,000 COLONIES/mL ESCHERICHIA COLI Performed at [facility]    Report Status 06/15/2015 FINAL  Final   Organism ID, Bacteria ESCHERICHIA COLI  Final      Susceptibility   Escherichia coli - MIC*  AMPICILLIN 8 SENSITIVE Sensitive     CEFAZOLIN <=4 SENSITIVE Sensitive     CEFTRIAXONE <=1 SENSITIVE Sensitive     CIPROFLOXACIN <=0.25 SENSITIVE Sensitive     GENTAMICIN <=1 SENSITIVE Sensitive     IMIPENEM <=0.25 SENSITIVE Sensitive     NITROFURANTOIN 32 SENSITIVE Sensitive     TRIMETH/SULFA <=20 SENSITIVE Sensitive     AMPICILLIN/SULBACTAM 4 SENSITIVE Sensitive     PIP/TAZO <=4 SENSITIVE Sensitive     * >=100,000 COLONIES/mL ESCHERICHIA COLI  Culture, blood (routine x 2)     Status: None (Preliminary result)   Collection Time: 06/15/15 12:30 PM  Result Value Ref Range Status   Specimen Description BLOOD RIGHT HAND  Final   Special Requests BOTTLES DRAWN AEROBIC AND ANAEROBIC 5CC  Final    Culture   Final    NO GROWTH < 24 HOURS Performed at [facility]    Report Status PENDING  Incomplete  Culture, blood (routine x 2)     Status: None (Preliminary result)   Collection Time: 06/15/15 12:40 PM  Result Value Ref Range Status   Specimen Description BLOOD LEFT ARM  Final   Special Requests BOTTLES DRAWN AEROBIC AND ANAEROBIC 10CC  Final   Culture   Final    NO GROWTH < 24 HOURS Performed at [facility]    Report Status PENDING  Incomplete    Anti-infectives    Start     Dose/Rate Route Frequency Ordered Stop   06/13/15 1100  cefTAZidime (FORTAZ) 2 g in dextrose 5 % 50 mL IVPB     2 g 100 mL/hr over 30 Minutes Intravenous Every 12 hours 06/13/15 0207     06/13/15 1000  vancomycin (VANCOCIN) IVPB 750 mg/150 ml premix  Status:  Discontinued     750 mg 150 mL/hr over 60 Minutes Intravenous Every 12 hours 06/13/15 0212 06/14/15 1216   06/13/15 0645  cefTAZidime (FORTAZ) 2 g in dextrose 5 % 50 mL IVPB  Status:  Discontinued     2 g 100 mL/hr over 30 Minutes Intravenous 3 times per day 06/13/15 0143 06/13/15 0206   06/12/15 2215  cefTAZidime (FORTAZ) 2 g in dextrose 5 % 50 mL IVPB  Status:  Discontinued     2 g 100 mL/hr over 30 Minutes Intravenous 3 times per day 06/12/15 2211 06/13/15 0206   06/12/15 2215  vancomycin (VANCOCIN) IVPB 1000 mg/200 mL premix     1,000 mg 200 mL/hr over 60 Minutes Intravenous  Once 06/12/15 2211 06/12/15 2359      Assessment: 81 yr female with fever, chills, nausea. Recent hospitalization 3 weeks ago for AMS and UTI. Upon admission, pharmacy consulted to dose vancomycin and ceftazidime for sepsis. BC x2, urine positive for E.coli, now on ceftazidime.  9/25 >> vanc >> 9/27 9/25 >> ceftazidime >>    9/25 blood: 2/2 E.coli - pan-sensitive 9/25 urine: E.coli  Afebrile WBCs wnl SCr wnl, CrCl = 56 PCT/LA unremarkable   Goal of Therapy:  Eradication of infection Appropriate antibiotic dosing for indication and renal  function  Plan:  Day 4 antibiotics  Increase ceftazidime to 2g IV q8 hr.  Patient appears clinically stable, but treating bacteremia and CrCl now stable above 50 ml/min.  Per ID, to continue 5 days IV abx, then transition to PO to complete 12 days total abx.  Suggest narrowing ceftazidime as E. Coli is pan-sensitive.   Bernadene Person, PharmD, BCPS Pager: [phone] 06/16/2015, 2:08 PM

## 2015-06-16 NOTE — Progress Notes (Signed)
[facility] PROGRESS NOTE  Chloe Wong WUJ:[id number] DOB: 07-12-1931 DOA: 06/12/2015 PCP: Ginette Otto, MD  Assessment/Plan: 1-SIRS/sepsis due to E. Coli UTI:  -lactic acid WNL -WBC's trending down and almost WNL -continue IV antibiotics; will d/c vancomycin   2-E. coli bacteremia: -cover by current antibiotics therapy -continue IV antibiotics (per ID curbside will need IV treatment for 5 days; then discharge on PO antibiotics and complete a total of 12 days) -day 4 IV antibiotics.  -discharge tomorrow after IV antibiotics.   3-HLD: will continue statins  4-GERD: will continue PPI  5-depression: continue celexa  6-Iron deficiency anemia: with recent admission where she received 2 units of PRBC's -Hgb 8.7 currently -no signs of overt bleeding -will continue niferex -follow Hgb trend -recent work up demonstrated ferritin of 6, suggesting to be secondary to iron deficiency anemia/.  -further evaluation by PCP.   Code Status: Full Family Communication: Daughter over the phone Disposition Plan: remains inpatient; continue IV antibiotics, follow cx data   Consultants:  None   Procedures:  See below for x-ray reports   Antibiotics:  Vanc 9/25>>9/27  Elita Quick 9/25  HPI/Subjective: Denies dysuria. Feeling ok.   Objective: Filed Vitals:   06/16/15 0830  BP: 160/67  Pulse: 76  Temp: 97.9 F (36.6 C)  Resp: 16    Intake/Output Summary (Last 24 hours) at 06/16/15 1234 Last data filed at 06/15/15 2226  Gross per 24 hour  Intake    700 ml  Output      0 ml  Net    700 ml   Filed Weights   06/12/15 2030 06/12/15 2139 06/13/15 0055  Weight: 74.39 kg (164 lb) 73.483 kg (162 lb) 74.208 kg (163 lb 9.6 oz)    Exam:   General: NAD  Cardiovascular: S1 and S2, no rubs or gallops  Respiratory: good air movement, no wheezing  Abdomen: soft, NT, ND, positive BS  Musculoskeletal: no edema.   Data Reviewed: Basic Metabolic Panel:  Recent  Labs Lab 06/12/15 2052 06/13/15 0210 06/15/15 0550 06/16/15 0505  NA 136 139 142 142  K 3.8 3.7 3.3* 3.7  CL 105 110 110 111  CO2 GLUCOSE 123* 113* 94 94  BUN 11 10 <5* 7  CREATININE 0.91 0.79 0.71 0.75  CALCIUM 9.0 8.5* 8.6* 8.7*   CBC:  Recent Labs Lab 06/12/15 2052 06/13/15 0210 06/15/15 0550  WBC 11.6* 10.6* 8.1  NEUTROABS 9.8*  --   --   HGB 9.6* 8.3* 8.7*  HCT 31.8* 27.5* 29.5*  MCV 73.1* 73.7* 73.8*  PLT 319 266 PLATELET CLUMPS NOTED ON SMEAR, COUNT APPEARS ADEQUATE   CBG: No results for input(s): GLUCAP in the last 168 hours.  Recent Results (from the past 240 hour(s))  Blood Culture (routine x 2)     Status: None   Collection Time: 06/12/15  9:00 PM  Result Value Ref Range Status   Specimen Description BLOOD RIGHT ANTECUBITAL  Final   Special Requests BOTTLES DRAWN AEROBIC AND ANAEROBIC  Final   Culture  Setup Time   Final    GRAM NEGATIVE RODS IN BOTH AEROBIC AND ANAEROBIC BOTTLES CRITICAL RESULT CALLED TO, READ BACK BY AND VERIFIED WITH: Kathleen Lime RN 13:05 06/13/15 ([email or web address])    Culture   Final    ESCHERICHIA COLI Performed at [facility]    Report Status 06/15/2015 FINAL  Final   Organism ID, Bacteria ESCHERICHIA COLI  Final      Susceptibility  Escherichia coli - MIC*    AMPICILLIN 4 SENSITIVE Sensitive     CEFAZOLIN <=4 SENSITIVE Sensitive     CEFEPIME <=1 SENSITIVE Sensitive     CEFTAZIDIME <=1 SENSITIVE Sensitive     CEFTRIAXONE <=1 SENSITIVE Sensitive     CIPROFLOXACIN <=0.25 SENSITIVE Sensitive     GENTAMICIN <=1 SENSITIVE Sensitive     IMIPENEM <=0.25 SENSITIVE Sensitive     TRIMETH/SULFA <=20 SENSITIVE Sensitive     AMPICILLIN/SULBACTAM <=2 SENSITIVE Sensitive     PIP/TAZO <=4 SENSITIVE Sensitive     * ESCHERICHIA COLI  Blood Culture (routine x 2)     Status: None   Collection Time: 06/12/15 10:10 PM  Result Value Ref Range Status   Specimen Description BLOOD LEFT ANTECUBITAL  Final   Special Requests  BOTTLES DRAWN AEROBIC AND ANAEROBIC  Final   Culture  Setup Time   Final    GRAM NEGATIVE RODS CRITICAL RESULT CALLED TO, READ BACK BY AND VERIFIED WITH: Kathleen Lime RN 13:05 06/13/15 ([email or web address]) IN BOTH AEROBIC AND ANAEROBIC BOTTLES    Culture   Final    ESCHERICHIA COLI Performed at [facility]    Report Status 06/15/2015 FINAL  Final  Urine culture     Status: None   Collection Time: 06/12/15 10:17 PM  Result Value Ref Range Status   Specimen Description URINE, CATHETERIZED  Final   Special Requests NONE  Final   Culture   Final    >=100,000 COLONIES/mL ESCHERICHIA COLI Performed at [facility]    Report Status 06/15/2015 FINAL  Final   Organism ID, Bacteria ESCHERICHIA COLI  Final      Susceptibility   Escherichia coli - MIC*    AMPICILLIN 8 SENSITIVE Sensitive     CEFAZOLIN <=4 SENSITIVE Sensitive     CEFTRIAXONE <=1 SENSITIVE Sensitive     CIPROFLOXACIN <=0.25 SENSITIVE Sensitive     GENTAMICIN <=1 SENSITIVE Sensitive     IMIPENEM <=0.25 SENSITIVE Sensitive     NITROFURANTOIN 32 SENSITIVE Sensitive     TRIMETH/SULFA <=20 SENSITIVE Sensitive     AMPICILLIN/SULBACTAM 4 SENSITIVE Sensitive     PIP/TAZO <=4 SENSITIVE Sensitive     * >=100,000 COLONIES/mL ESCHERICHIA COLI     Studies: No results found.  Scheduled Meds: . cefTAZidime (FORTAZ)  IV  2 g Intravenous Q12H  . citalopram  10 mg Oral Daily  . enoxaparin (LOVENOX) injection  40 mg Subcutaneous Q24H  . iron polysaccharides  150 mg Oral Daily  . loratadine  10 mg Oral Daily  . mirabegron ER  50 mg Oral Daily  . multivitamin with minerals  1 tablet Oral BID  . pantoprazole  40 mg Oral Daily  . pravastatin  10 mg Oral Daily   Continuous Infusions:    Principal Problem:   Sepsis Active Problems:   UTI (lower urinary tract infection)   HTN (hypertension)   Anemia    Time spent: 30 minutes    Brodan Grewell A  [facility] Pager [phone]. If 7PM-7AM, please contact  night-coverage at [email or web address], password [facility] 06/16/2015, 12:34 PM  LOS: 4 days

## 2015-06-16 NOTE — Care Management Important Message (Signed)
Important Message  Patient Details  Name: DHANYA BOGLE MRN: [id number] Date of Birth: 1930-11-13   Medicare Important Message Given:  Yes-second notification given    Renie Ora 06/16/2015, 3:30 PMImportant Message  Patient Details  Name: SAHARRA SANTO MRN: [id number] Date of Birth: 03/28/1931   Medicare Important Message Given:  Yes-second notification given    Renie Ora 06/16/2015, 3:29 PM

## 2015-06-17 MED ORDER — CEPHALEXIN 500 MG PO CAPS: 500.0000 mg | ORAL_CAPSULE | Freq: Three times a day (TID) | ORAL | Status: DC

## 2015-06-17 MED ORDER — POLYSACCHARIDE IRON COMPLEX 150 MG PO CAPS: 150.0000 mg | ORAL_CAPSULE | Freq: Every day | ORAL | Status: AC

## 2015-06-17 NOTE — Discharge Summary (Signed)
Physician Discharge Summary  IO DIEUJUSTE Jaffee ZOX:[id number] DOB: September 11, 1931 DOA: 06/12/2015  PCP: Ginette Otto, MD  Admit date: 06/12/2015 Discharge date: 06/17/2015  Time spent: 35 minutes  Recommendations for Outpatient Follow-up:  Needs further evaluation of anemia.  Needs further evaluation for recurrent UTI.   Discharge Diagnoses:  Principal Problem:   Sepsis Active Problems:   UTI (lower urinary tract infection)   HTN (hypertension)   Anemia   Discharge Condition: Stable.   Diet recommendation: Heart healthy  Filed Weights   06/12/15 2030 06/12/15 2139 06/13/15 0055  Weight: 74.39 kg (164 lb) 73.483 kg (162 lb) 74.208 kg (163 lb 9.6 oz)    History of present illness:  Chloe Wong is a 81 y.o. female with a history of HTN, Hyperlipidemia who presents tot he ED with complaints of fevers and chills and nausea and Light headedness for the past 2 days. She denies any Chest, ABD, Back pain and dysuria. She does report having nasal congestion. She was found to have a temperature of 102. 6 in the ED and a Sepsis workup was inititated. She was found to have a +UA, and an Elevated LActic Acid Level and a Negative Chest X-ray. She was placed on IV Vancomycin and Ceftazidime and referred for admission. She was hospitalized 3 weeks ago with Altered Mental Status and UTI and was dicharged to home on Cefuroxime.   Hospital Course:  1-SIRS/sepsis due to E. Coli UTI:  -lactic acid WNL -WBC's trending down and almost WNL Treated with IV antibiotics.   2-E. coli bacteremia: -cover by current antibiotics therapy -continue IV antibiotics (per ID curbside will need IV treatment for 5 days; then discharge on PO antibiotics and complete a total of 12 days) -day 5 IV antibiotics. recieved 5 days of ceftazidime. She will be discharge on Keflex for total of 14 days.   3-HLD: will continue statins  4-GERD: will continue PPI  5-depression: continue  celexa  6-Iron deficiency anemia: with recent admission where she received 2 units of PRBC's -Hgb 8.7 currently -no signs of overt bleeding -will continue niferex -follow Hgb trend -recent work up demonstrated ferritin of 6, suggesting to be secondary to iron deficiency anemia/.  -further evaluation by PCP.  Procedures:  none  Consultations:  none  Discharge Exam: Filed Vitals:   06/17/15 0913  BP: 132/68  Pulse: 91  Temp: 98.1 F (36.7 C)  Resp: 18    General: NAD Cardiovascular: S 1, S 2 RRR Respiratory: CTA  Discharge Instructions    Current Discharge Medication List    START taking these medications   Details  cephALEXin (KEFLEX) 500 MG capsule Take 1 capsule (500 mg total) by mouth 3 (three) times daily. Qty: 27 capsule, Refills: 0    iron polysaccharides (NIFEREX) 150 MG capsule Take 1 capsule (150 mg total) by mouth daily. Qty: 30 capsule, Refills: 3      CONTINUE these medications which have NOT CHANGED   Details  citalopram (CELEXA) 10 MG tablet Take 10 mg by mouth daily.    loratadine (CLARITIN) 10 MG tablet Take 10 mg by mouth daily.    Multiple Vitamin (MULTIVITAMIN WITH MINERALS) TABS tablet Take 1 tablet by mouth 2 (two) times daily.     MYRBETRIQ 50 MG TB24 tablet Take 50 mg by mouth daily.    omeprazole (PRILOSEC) 20 MG capsule Take 20 mg by mouth daily.    pravastatin (PRAVACHOL) 10 MG tablet Take 10 mg by mouth daily.  STOP taking these medications     ferrous sulfate 325 (65 FE) MG tablet      cefUROXime (CEFTIN) 250 MG tablet      pantoprazole (PROTONIX) 40 MG tablet      sucralfate (CARAFATE) 1 GM/10ML suspension        No Known Allergies Follow-up Information    Follow up with Ginette Otto, MD In 1 week.   Specialty:  Internal Medicine   Contact information:   [address] [place] [address] [phone]        The results of significant diagnostics from this hospitalization  (including imaging, microbiology, ancillary and laboratory) are listed below for reference.    Significant Diagnostic Studies: Dg Chest 2 View  06/12/2015   CLINICAL DATA:  Dizziness with cough and congestion, fever  EXAM: CHEST - 2 VIEW  COMPARISON:  05/19/2015  FINDINGS: Cardiac shadow is within normal limits. A large hiatal hernia is again identified. No focal infiltrate or sizable effusion is seen. No bony abnormality is noted.  IMPRESSION: No acute abnormality seen.  Stable hiatal hernia.   Electronically Signed   By: Alcide Clever M.D.   On: 06/12/2015 21:46   Dg Chest 2 View  05/19/2015   CLINICAL DATA:  Fever.  Productive cough.  Congestion.  EXAM: CHEST  2 VIEW  COMPARISON:  10/14/2013 chest radiograph.  FINDINGS: Stable normal size cardiac silhouette. Re- demonstration of a retrocardiac density with air-fluid level, most in keeping with a moderate hiatal hernia. Mediastinal contour is stable with mild tortuosity of the atherosclerotic thoracic aorta. No pneumothorax. No pleural effusion. Clear lungs, with no pulmonary edema and no focal lung consolidation to suggest a pneumonia. Mild degenerative changes in the thoracic spine. Cholecystectomy clips in the right upper quadrant of the abdomen.  IMPRESSION: 1. No active cardiopulmonary disease. No focal lung consolidation to suggest a pneumonia. 2. Stable moderate hiatal hernia.   Electronically Signed   By: Delbert Phenix M.D.   On: 05/19/2015 12:49    Microbiology: Recent Results (from the past 240 hour(s))  Blood Culture (routine x 2)     Status: None   Collection Time: 06/12/15  9:00 PM  Result Value Ref Range Status   Specimen Description BLOOD RIGHT ANTECUBITAL  Final   Special Requests BOTTLES DRAWN AEROBIC AND ANAEROBIC  Final   Culture  Setup Time   Final    GRAM NEGATIVE RODS IN BOTH AEROBIC AND ANAEROBIC BOTTLES CRITICAL RESULT CALLED TO, READ BACK BY AND VERIFIED WITH: Kathleen Lime RN 13:05 06/13/15 ([email or web address])    Culture   Final     ESCHERICHIA COLI Performed at [facility]    Report Status 06/15/2015 FINAL  Final   Organism ID, Bacteria ESCHERICHIA COLI  Final      Susceptibility   Escherichia coli - MIC*    AMPICILLIN 4 SENSITIVE Sensitive     CEFAZOLIN <=4 SENSITIVE Sensitive     CEFEPIME <=1 SENSITIVE Sensitive     CEFTAZIDIME <=1 SENSITIVE Sensitive     CEFTRIAXONE <=1 SENSITIVE Sensitive     CIPROFLOXACIN <=0.25 SENSITIVE Sensitive     GENTAMICIN <=1 SENSITIVE Sensitive     IMIPENEM <=0.25 SENSITIVE Sensitive     TRIMETH/SULFA <=20 SENSITIVE Sensitive     AMPICILLIN/SULBACTAM <=2 SENSITIVE Sensitive     PIP/TAZO <=4 SENSITIVE Sensitive     * ESCHERICHIA COLI  Blood Culture (routine x 2)     Status: None   Collection Time: 06/12/15 10:10  PM  Result Value Ref Range Status   Specimen Description BLOOD LEFT ANTECUBITAL  Final   Special Requests BOTTLES DRAWN AEROBIC AND ANAEROBIC  Final   Culture  Setup Time   Final    GRAM NEGATIVE RODS CRITICAL RESULT CALLED TO, READ BACK BY AND VERIFIED WITH: E. Caudle RN 13:05 06/13/15 ([email or web address]) IN BOTH AEROBIC AND ANAEROBIC BOTTLES    Culture   Final    ESCHERICHIA COLI Performed at [facility]    Report Status 06/15/2015 FINAL  Final  Urine culture     Status: None   Collection Time: 06/12/15 10:17 PM  Result Value Ref Range Status   Specimen Description URINE, CATHETERIZED  Final   Special Requests NONE  Final   Culture   Final    >=100,000 COLONIES/mL ESCHERICHIA COLI Performed at [facility]    Report Status 06/15/2015 FINAL  Final   Organism ID, Bacteria ESCHERICHIA COLI  Final      Susceptibility   Escherichia coli - MIC*    AMPICILLIN 8 SENSITIVE Sensitive     CEFAZOLIN <=4 SENSITIVE Sensitive     CEFTRIAXONE <=1 SENSITIVE Sensitive     CIPROFLOXACIN <=0.25 SENSITIVE Sensitive     GENTAMICIN <=1 SENSITIVE Sensitive     IMIPENEM <=0.25 SENSITIVE Sensitive     NITROFURANTOIN 32 SENSITIVE Sensitive     TRIMETH/SULFA  <=20 SENSITIVE Sensitive     AMPICILLIN/SULBACTAM 4 SENSITIVE Sensitive     PIP/TAZO <=4 SENSITIVE Sensitive     * >=100,000 COLONIES/mL ESCHERICHIA COLI  Culture, blood (routine x 2)     Status: None (Preliminary result)   Collection Time: 06/15/15 12:30 PM  Result Value Ref Range Status   Specimen Description BLOOD RIGHT HAND  Final   Special Requests BOTTLES DRAWN AEROBIC AND ANAEROBIC 5CC  Final   Culture   Final    NO GROWTH 1 DAY Performed at [facility]    Report Status PENDING  Incomplete  Culture, blood (routine x 2)     Status: None (Preliminary result)   Collection Time: 06/15/15 12:40 PM  Result Value Ref Range Status   Specimen Description BLOOD LEFT ARM  Final   Special Requests BOTTLES DRAWN AEROBIC AND ANAEROBIC 10CC  Final   Culture   Final    NO GROWTH 1 DAY Performed at [facility]    Report Status PENDING  Incomplete     Labs: Basic Metabolic Panel:  Recent Labs Lab 06/12/15 2052 06/13/15 0210 06/15/15 0550 06/16/15 0505  NA 136 139 142 142  K 3.8 3.7 3.3* 3.7  CL 105 110 110 111  CO2 GLUCOSE 123* 113* 94 94  BUN 11 10 <5* 7  CREATININE 0.91 0.79 0.71 0.75  CALCIUM 9.0 8.5* 8.6* 8.7*   Liver Function Tests: No results for input(s): AST, ALT, ALKPHOS, BILITOT, PROT, ALBUMIN in the last 168 hours. No results for input(s): LIPASE, AMYLASE in the last 168 hours. No results for input(s): AMMONIA in the last 168 hours. CBC:  Recent Labs Lab 06/12/15 2052 06/13/15 0210 06/15/15 0550  WBC 11.6* 10.6* 8.1  NEUTROABS 9.8*  --   --   HGB 9.6* 8.3* 8.7*  HCT 31.8* 27.5* 29.5*  MCV 73.1* 73.7* 73.8*  PLT 319 266 PLATELET CLUMPS NOTED ON SMEAR, COUNT APPEARS ADEQUATE   Cardiac Enzymes: No results for input(s): CKTOTAL, CKMB, CKMBINDEX, TROPONINI in the last 168 hours. BNP: BNP (last 3 results) No results for input(s):  BNP in the last 8760 hours.  ProBNP (last 3 results) No results for input(s): PROBNP in the last  8760 hours.  CBG: No results for input(s): GLUCAP in the last 168 hours.     SignedHartley Barefoot A  [facility] 06/17/2015, 11:38 AM

## 2015-06-17 NOTE — Progress Notes (Signed)
Discharge instructions given to pt, verbalized understanding. Left the unit in stable condition. 

## 2015-06-17 NOTE — Care Management Note (Signed)
Case Management Note  Patient Details  Name: Chloe Wong MRN: [id number] Date of Birth: Sep 13, 1931  Subjective/Objective:    Faxed HHRN order w/confirmation to [facility] fax#[phone].d/c home today.                Action/Plan:d/c home w/HHRN   Expected Discharge Date:   (UNKNOWN)               Expected Discharge Plan:  Home w Home Health Services  In-House Referral:     Discharge planning Services  CM Consult  Post Acute Care Choice:    Choice offered to:  Patient  DME Arranged:    DME Agency:     HH Arranged:  RN HH Agency:   ([facility]-use their own HHC agency-[facility].)  Status of Service:  Completed, signed off  Medicare Important Message Given:  Yes-second notification given Date Medicare IM Given:    Medicare IM give by:    Date Additional Medicare IM Given:    Additional Medicare Important Message give by:     If discussed at Long Length of Stay Meetings, dates discussed:    Additional Comments:  Lanier Clam, RN 06/17/2015, 2:53 PM

## 2015-06-20 LAB — CULTURE, BLOOD (ROUTINE X 2)
CULTURE: NO GROWTH
CULTURE: NO GROWTH

## 2015-12-20 ENCOUNTER — Other Ambulatory Visit (HOSPITAL_COMMUNITY): Payer: Self-pay | Admitting: *Deleted

## 2015-12-21 ENCOUNTER — Ambulatory Visit (HOSPITAL_COMMUNITY)
Admission: RE | Admit: 2015-12-21 | Discharge: 2015-12-21 | Disposition: A | Discharge status: Home / Self Care | Payer: Medicare Other | Source: Ambulatory Visit | Attending: Geriatric Medicine | Admitting: Geriatric Medicine

## 2015-12-21 DIAGNOSIS — D509 Iron deficiency anemia, unspecified: Secondary | ICD-10-CM | POA: Insufficient documentation

## 2015-12-21 MED ORDER — SODIUM CHLORIDE 0.9 % IV SOLN
1000.0000 mg | Freq: Once | INTRAVENOUS | Status: AC
  Administered 2015-12-21: 1000 mg via INTRAVENOUS
  Filled 2015-12-21: qty 20

## 2015-12-21 MED ORDER — SODIUM CHLORIDE 0.9 % IV SOLN
25.0000 mg | Freq: Once | INTRAVENOUS | Status: AC
  Administered 2015-12-21: 25 mg via INTRAVENOUS
  Filled 2015-12-21: qty 0.5

## 2015-12-27 ENCOUNTER — Emergency Department (HOSPITAL_COMMUNITY): Payer: Medicare Other

## 2015-12-27 ENCOUNTER — Encounter (HOSPITAL_COMMUNITY): Payer: Self-pay | Admitting: Emergency Medicine

## 2015-12-27 ENCOUNTER — Emergency Department (HOSPITAL_COMMUNITY)
Admission: EM | Admit: 2015-12-27 | Discharge: 2015-12-28 | Disposition: A | Discharge status: Home / Self Care | Payer: Medicare Other | Attending: Emergency Medicine | Admitting: Emergency Medicine

## 2015-12-27 DIAGNOSIS — R0981 Nasal congestion: Secondary | ICD-10-CM | POA: Diagnosis not present

## 2015-12-27 DIAGNOSIS — K219 Gastro-esophageal reflux disease without esophagitis: Secondary | ICD-10-CM | POA: Diagnosis not present

## 2015-12-27 DIAGNOSIS — D649 Anemia, unspecified: Secondary | ICD-10-CM | POA: Insufficient documentation

## 2015-12-27 DIAGNOSIS — F329 Major depressive disorder, single episode, unspecified: Secondary | ICD-10-CM | POA: Diagnosis not present

## 2015-12-27 DIAGNOSIS — Z8739 Personal history of other diseases of the musculoskeletal system and connective tissue: Secondary | ICD-10-CM | POA: Diagnosis not present

## 2015-12-27 DIAGNOSIS — R197 Diarrhea, unspecified: Secondary | ICD-10-CM | POA: Insufficient documentation

## 2015-12-27 DIAGNOSIS — R112 Nausea with vomiting, unspecified: Secondary | ICD-10-CM | POA: Diagnosis present

## 2015-12-27 DIAGNOSIS — E785 Hyperlipidemia, unspecified: Secondary | ICD-10-CM | POA: Insufficient documentation

## 2015-12-27 DIAGNOSIS — Z79899 Other long term (current) drug therapy: Secondary | ICD-10-CM | POA: Insufficient documentation

## 2015-12-27 DIAGNOSIS — Z792 Long term (current) use of antibiotics: Secondary | ICD-10-CM | POA: Diagnosis not present

## 2015-12-27 DIAGNOSIS — N390 Urinary tract infection, site not specified: Secondary | ICD-10-CM | POA: Diagnosis not present

## 2015-12-27 MED ORDER — SODIUM CHLORIDE 0.9 % IV BOLUS (SEPSIS)
1000.0000 mL | Freq: Once | INTRAVENOUS | Status: AC
  Administered 2015-12-27: 1000 mL via INTRAVENOUS

## 2015-12-27 MED ORDER — ONDANSETRON HCL 4 MG/2ML IJ SOLN
4.0000 mg | Freq: Once | INTRAMUSCULAR | Status: AC
  Administered 2015-12-27: 4 mg via INTRAVENOUS
  Filled 2015-12-27: qty 2

## 2015-12-27 NOTE — ED Notes (Signed)
Pt BIB EMS from [facility]; pt ate at [employer] on Sunday that she believes may be impacting her condition; pt has been experiencing nausea since Sunday; pt has been dry-heaving but not producing any emesis.  BP: 115/77 P: 102 O2: 97%

## 2015-12-27 NOTE — ED Notes (Signed)
[facility] Expected date:  Expected time:  Means of arrival:  Comments: EMS  Nausea and vomiting

## 2015-12-27 NOTE — ED Provider Notes (Signed)
CSN: [id number]     Arrival date & time 12/27/15  2218 History  By signing my name below, I, Budd Palmer, attest that this documentation has been prepared under the direction and in the presence of Gilda Crease, MD. Electronically Signed: Budd Palmer, ED Scribe. 12/27/2015. 11:56 PM.     Chief Complaint  Patient presents with  . Nausea  . Emesis   The history is provided by the patient and a relative. No language interpreter was used.   HPI Comments: Tecora Eustache is a 82 y.o. female with a PMHx of HLD and anemia brought in by ambulance who presents to the Emergency Department complaining of nausea and emesis (dry-heaving) onset 2 days ago. Pt states she at at [facility] 2 days ago and believes she may have eaten something that caused her to be sick. She notes she has mostly been dry-heaving without bringing anything up. She reports associated diarrhea and congestion. Per son, pt has been taking tylenol and may have taken too much of it due to confusion of the time between pills. Pt notes she has been taking iron for her anemia, but denies taking any fluid pills. She also reports a PMHx of UTI's. Pt denies abdominal pain, chest pain, cough, and rhinorrhea.   Past Medical History  Diagnosis Date  . Urine, incontinence, stress female   . GERD (gastroesophageal reflux disease)   . Arthritis     right leg  . Hyperlipidemia   . Anemia   . Depression    Past Surgical History  Procedure Laterality Date  . Bladder lift  9 yrs ago  . Cholecystectomy  20 yrs ago  . Colonoscopy with propofol N/A 07/07/2013    Procedure: COLONOSCOPY WITH PROPOFOL;  Surgeon: Charolett Bumpers, MD;  Location: [facility];  Service: Endoscopy;  Laterality: N/A;  . Esophagogastroduodenoscopy (egd) with propofol N/A 07/07/2013    Procedure: ESOPHAGOGASTRODUODENOSCOPY (EGD) WITH PROPOFOL;  Surgeon: Charolett Bumpers, MD;  Location: [facility];  Service: Endoscopy;  Laterality: N/A;   Family  History  Problem Relation Age of Onset  . Heart disease Father   . Cancer Mother    Social History  Substance Use Topics  . Smoking status: Never Smoker   . Smokeless tobacco: Never Used  . Alcohol Use: No   OB History    No data available     Review of Systems  HENT: Positive for congestion. Negative for rhinorrhea.   Respiratory: Negative for cough.   Cardiovascular: Negative for chest pain.  Gastrointestinal: Positive for nausea, vomiting and diarrhea. Negative for abdominal pain.  Musculoskeletal: Negative for myalgias.  All other systems reviewed and are negative.   Allergies  Review of patient's allergies indicates no known allergies.  Home Medications   Prior to Admission medications   Medication Sig Start Date End Date Taking? Authorizing Provider  cephALEXin (KEFLEX) 500 MG capsule Take 1 capsule (500 mg total) by mouth 3 (three) times daily. 06/17/15   [name], MD  citalopram (CELEXA) 10 MG tablet Take 10 mg by mouth daily. 05/05/15   Historical Provider, MD  iron polysaccharides (NIFEREX) 150 MG capsule Take 1 capsule (150 mg total) by mouth daily. 06/17/15   [name], MD  loratadine (CLARITIN) 10 MG tablet Take 10 mg by mouth daily.    Historical Provider, MD  Multiple Vitamin (MULTIVITAMIN WITH MINERALS) TABS tablet Take 1 tablet by mouth 2 (two) times daily.     Historical Provider, MD  MYRBETRIQ 50  MG TB24 tablet Take 50 mg by mouth daily. 05/05/15   Historical Provider, MD  omeprazole (PRILOSEC) 20 MG capsule Take 20 mg by mouth daily.    Historical Provider, MD  pravastatin (PRAVACHOL) 10 MG tablet Take 10 mg by mouth daily. 04/15/15   Historical Provider, MD   BP 115/55 mmHg  Pulse 92  Temp(Src) 101.4 F (38.6 C) (Oral)  Resp 18  Ht  (1.6 m)  Wt 160 lb (72.576 kg)  BMI 28.35 kg/m2  SpO2 94% Physical Exam  Constitutional: She is oriented to person, place, and time. She appears well-developed and well-nourished. No distress.  HENT:   Head: Normocephalic and atraumatic.  Right Ear: Hearing normal.  Left Ear: Hearing normal.  Nose: Nose normal.  Mouth/Throat: Oropharynx is clear and moist and mucous membranes are normal.  Eyes: Conjunctivae and EOM are normal. Pupils are equal, round, and reactive to light.  Neck: Normal range of motion. Neck supple.  Cardiovascular: Regular rhythm, S1 normal and S2 normal.  Exam reveals no gallop and no friction rub.   No murmur heard. Pulmonary/Chest: Effort normal and breath sounds normal. No respiratory distress. She exhibits no tenderness.  Abdominal: Soft. Normal appearance and bowel sounds are normal. There is no hepatosplenomegaly. There is no tenderness. There is no rebound, no guarding, no tenderness at McBurney's point and negative Murphy's sign. No hernia.  Musculoskeletal: Normal range of motion.  Neurological: She is alert and oriented to person, place, and time. She has normal strength. No cranial nerve deficit or sensory deficit. Coordination normal. GCS eye subscore is 4. GCS verbal subscore is 5. GCS motor subscore is 6.  Skin: Skin is warm, dry and intact. No rash noted. No cyanosis.  Psychiatric: She has a normal mood and affect. Her speech is normal and behavior is normal. Thought content normal.  Nursing note and vitals reviewed.   ED Course  Procedures  DIAGNOSTIC STUDIES: Oxygen Saturation is 94% on RA, low by my interpretation.    COORDINATION OF CARE: 11:19 PM - Discussed plans to order diagnostic studies and imaging, as well as IV fluids and anti-nausea medicine. Pt advised of plan for treatment and pt agrees.  Labs Review Labs Reviewed  COMPREHENSIVE METABOLIC PANEL - Abnormal; Notable for the following:    Sodium 134 (*)    CO2 21 (*)    Glucose, Bld 135 (*)    Calcium 8.7 (*)    Total Protein 6.1 (*)    Albumin 3.1 (*)    AST 52 (*)    All other components within normal limits  URINALYSIS, ROUTINE W REFLEX MICROSCOPIC (NOT AT [facility]) - Abnormal;  Notable for the following:    APPearance TURBID (*)    Hgb urine dipstick SMALL (*)    Ketones, ur 15 (*)    Protein, ur 30 (*)    Leukocytes, UA LARGE (*)    All other components within normal limits  ACETAMINOPHEN LEVEL - Abnormal; Notable for the following:    Acetaminophen (Tylenol), Serum <10 (*)    All other components within normal limits  CBC WITH DIFFERENTIAL/PLATELET - Abnormal; Notable for the following:    WBC 13.8 (*)    RBC 3.79 (*)    Hemoglobin 8.7 (*)    HCT 29.5 (*)    MCV 77.8 (*)    MCH 23.0 (*)    MCHC 29.5 (*)    RDW 28.1 (*)    Neutro Abs 11.7 (*)    Monocytes Absolute 1.1 (*)  All other components within normal limits  URINE MICROSCOPIC-ADD ON - Abnormal; Notable for the following:    Squamous Epithelial / LPF 0-5 (*)    Bacteria, UA MANY (*)    All other components within normal limits  LIPASE, BLOOD  CBC WITH DIFFERENTIAL/PLATELET    Imaging Review Dg Chest 2 View  12/27/2015  CLINICAL DATA:  Cough with vomiting and diarrhea for 2 days. Chest congestion. EXAM: CHEST  2 VIEW COMPARISON:  June 12, 2015 FINDINGS: Lungs are clear. Heart size and pulmonary vascularity are normal. No adenopathy. There is a sizable hiatal type hernia. No bone lesions. IMPRESSION: Sizable hiatal type hernia.  No edema or consolidation. Electronically Signed   By: Bretta BangWilliam  Woodruff III M.D.   On: 12/27/2015 23:50   I have personally reviewed and evaluated these images and lab results as part of my medical decision-making.   EKG Interpretation None        Date: 12/28/2015  Rate: 90  Rhythm: normal sinus rhythm  QRS Axis: normal  Intervals: normal  ST/T Wave abnormalities: normal  Conduction Disutrbances: none  Narrative Interpretation: unremarkable      MDM   Final diagnoses:  UTI (lower urinary tract infection)    Patient presents to the ER for evaluation of 2 days of nausea and dry heaves with diarrhea. Patient thinks that it was the food that she  ate at a restaurant prior to onset of symptoms. She is not expressing any abdominal pain. Her abdominal exam is entirely benign. Patient administered IV fluids and Zofran.  She does have a fever here in the ER. Lab work is essentially unremarkable, at her normal baseline, but urinalysis suggests infection. This may be because of all the patient's symptoms. She is awake, alert, mentating well. Vital signs are otherwise unremarkable. No sign of sepsis. Patient had a UTI in 2016 that was Escherichia coli that was pansensitive. Patient administered Rocephin here in the ER and will be prescribed Ceftin for home use, follow-up with PCP. Return if symptoms worsen.  I personally performed the services described in this documentation, which was scribed in my presence. The recorded information has been reviewed and is accurate.   Gilda Creasehristopher J Pollina, MD 12/28/15 [phone]

## 2015-12-28 ENCOUNTER — Other Ambulatory Visit: Payer: Self-pay

## 2015-12-28 DIAGNOSIS — D509 Iron deficiency anemia, unspecified: Secondary | ICD-10-CM | POA: Diagnosis not present

## 2015-12-28 LAB — CBC WITH DIFFERENTIAL/PLATELET
BASOS ABS: 0 10*3/uL (ref 0.0–0.1)
Basophils Relative: 0 %
EOS ABS: 0 10*3/uL (ref 0.0–0.7)
EOS PCT: 0 %
HCT: 29.5 % — ABNORMAL LOW (ref 36.0–46.0)
Hemoglobin: 8.7 g/dL — ABNORMAL LOW (ref 12.0–15.0)
LYMPHS ABS: 1 10*3/uL (ref 0.7–4.0)
LYMPHS PCT: 7 %
MCH: 23 pg — AB (ref 26.0–34.0)
MCHC: 29.5 g/dL — ABNORMAL LOW (ref 30.0–36.0)
MCV: 77.8 fL — AB (ref 78.0–100.0)
MONO ABS: 1.1 10*3/uL — AB (ref 0.1–1.0)
Monocytes Relative: 8 %
Neutro Abs: 11.7 10*3/uL — ABNORMAL HIGH (ref 1.7–7.7)
Neutrophils Relative %: 85 %
PLATELETS: 281 10*3/uL (ref 150–400)
RBC: 3.79 MIL/uL — AB (ref 3.87–5.11)
RDW: 28.1 % — AB (ref 11.5–15.5)
WBC: 13.8 10*3/uL — AB (ref 4.0–10.5)

## 2015-12-28 LAB — COMPREHENSIVE METABOLIC PANEL
ALK PHOS: 65 U/L (ref 38–126)
ALT: 27 U/L (ref 14–54)
ANION GAP: 7 (ref 5–15)
AST: 52 U/L — ABNORMAL HIGH (ref 15–41)
Albumin: 3.1 g/dL — ABNORMAL LOW (ref 3.5–5.0)
BUN: 11 mg/dL (ref 6–20)
CALCIUM: 8.7 mg/dL — AB (ref 8.9–10.3)
CO2: 21 mmol/L — ABNORMAL LOW (ref 22–32)
CREATININE: 0.82 mg/dL (ref 0.44–1.00)
Chloride: 106 mmol/L (ref 101–111)
Glucose, Bld: 135 mg/dL — ABNORMAL HIGH (ref 65–99)
Potassium: 4.3 mmol/L (ref 3.5–5.1)
Sodium: 134 mmol/L — ABNORMAL LOW (ref 135–145)
TOTAL PROTEIN: 6.1 g/dL — AB (ref 6.5–8.1)
Total Bilirubin: 0.7 mg/dL (ref 0.3–1.2)

## 2015-12-28 LAB — URINALYSIS, ROUTINE W REFLEX MICROSCOPIC
BILIRUBIN URINE: NEGATIVE
Glucose, UA: NEGATIVE mg/dL
KETONES UR: 15 mg/dL — AB
NITRITE: NEGATIVE
PROTEIN: 30 mg/dL — AB
Specific Gravity, Urine: 1.018 (ref 1.005–1.030)
pH: 6 (ref 5.0–8.0)

## 2015-12-28 LAB — URINE MICROSCOPIC-ADD ON

## 2015-12-28 LAB — ACETAMINOPHEN LEVEL

## 2015-12-28 LAB — LIPASE, BLOOD: Lipase: 23 U/L (ref 11–51)

## 2015-12-28 MED ORDER — CEFUROXIME AXETIL 250 MG PO TABS
250.0000 mg | ORAL_TABLET | Freq: Two times a day (BID) | ORAL | Status: DC
Start: 2015-12-28 — End: 2019-10-11

## 2015-12-28 MED ORDER — ONDANSETRON HCL 4 MG/2ML IJ SOLN
4.0000 mg | Freq: Once | INTRAMUSCULAR | Status: AC
  Administered 2015-12-28: 4 mg via INTRAVENOUS
  Filled 2015-12-28: qty 2

## 2015-12-28 MED ORDER — ONDANSETRON HCL 4 MG PO TABS: 4.0000 mg | ORAL_TABLET | Freq: Four times a day (QID) | ORAL | Status: DC

## 2015-12-28 MED ORDER — DEXTROSE 5 % IV SOLN
1.0000 g | Freq: Once | INTRAVENOUS | Status: AC
  Administered 2015-12-28: 1 g via INTRAVENOUS
  Filled 2015-12-28: qty 10

## 2015-12-28 NOTE — Discharge Instructions (Signed)

## 2015-12-30 LAB — URINE CULTURE: Culture: >=100000 — AB

## 2015-12-31 ENCOUNTER — Telehealth (HOSPITAL_BASED_OUTPATIENT_CLINIC_OR_DEPARTMENT_OTHER): Payer: Self-pay | Admitting: Emergency Medicine

## 2015-12-31 NOTE — Telephone Encounter (Signed)
Post ED Visit - Positive Culture Follow-up  Culture report reviewed by antimicrobial stewardship pharmacist:  []  Enzo BiNathan Batchelder, Pharm.D. []  Celedonio MiyamotoJeremy Frens, Pharm.D., BCPS []  Garvin FilaMike Maccia, Pharm.D. []  Georgina PillionElizabeth Martin, Pharm.D., BCPS [x]  ColumbiaMinh Pham, [address]Pharm[address]D., BCPS, AAHIVP []  Estella HuskMichelle Turner, Pharm.D., BCPS, AAHIVP []  Tennis Mustassie Stewart, Pharm.D. []  Sherle Poeob Vincent, [address]Pharm[address]  Positive urine culture Treated with cefurozime, organism sensitive to the same and no further patient follow-up is required at this time.  Berle MullMiller, Kimanh Templeman 12/31/2015, 11:58 AM

## 2017-02-07 IMAGING — CR DG CHEST 2V
2 series · 2 of 2 positions shown · non-contrast
Comparison: 10/14/2013 chest radiograph.

CLINICAL DATA: Fever.  Productive cough.  Congestion.

EXAM:
CHEST  2 VIEW

[w chest pa]
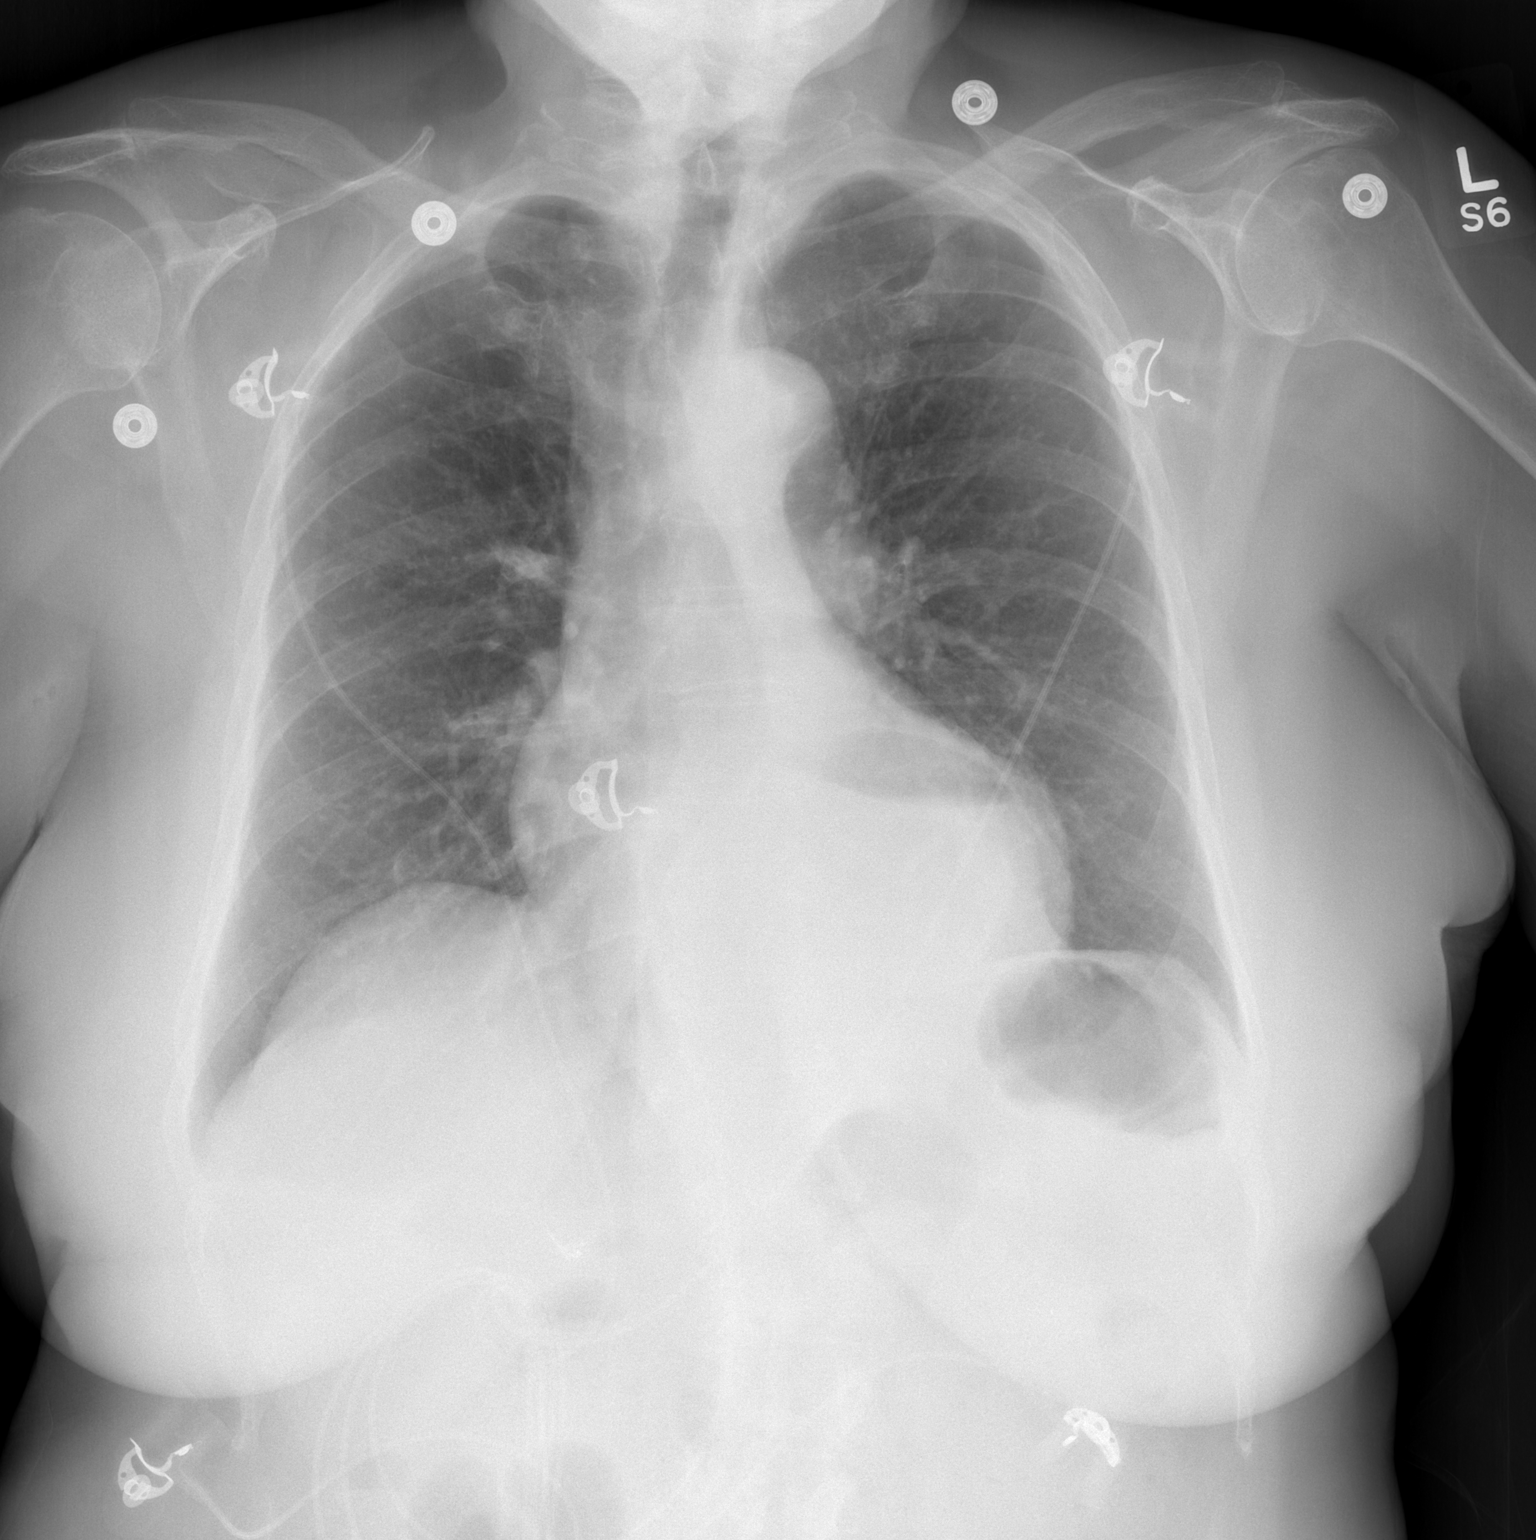

[w chest lat]
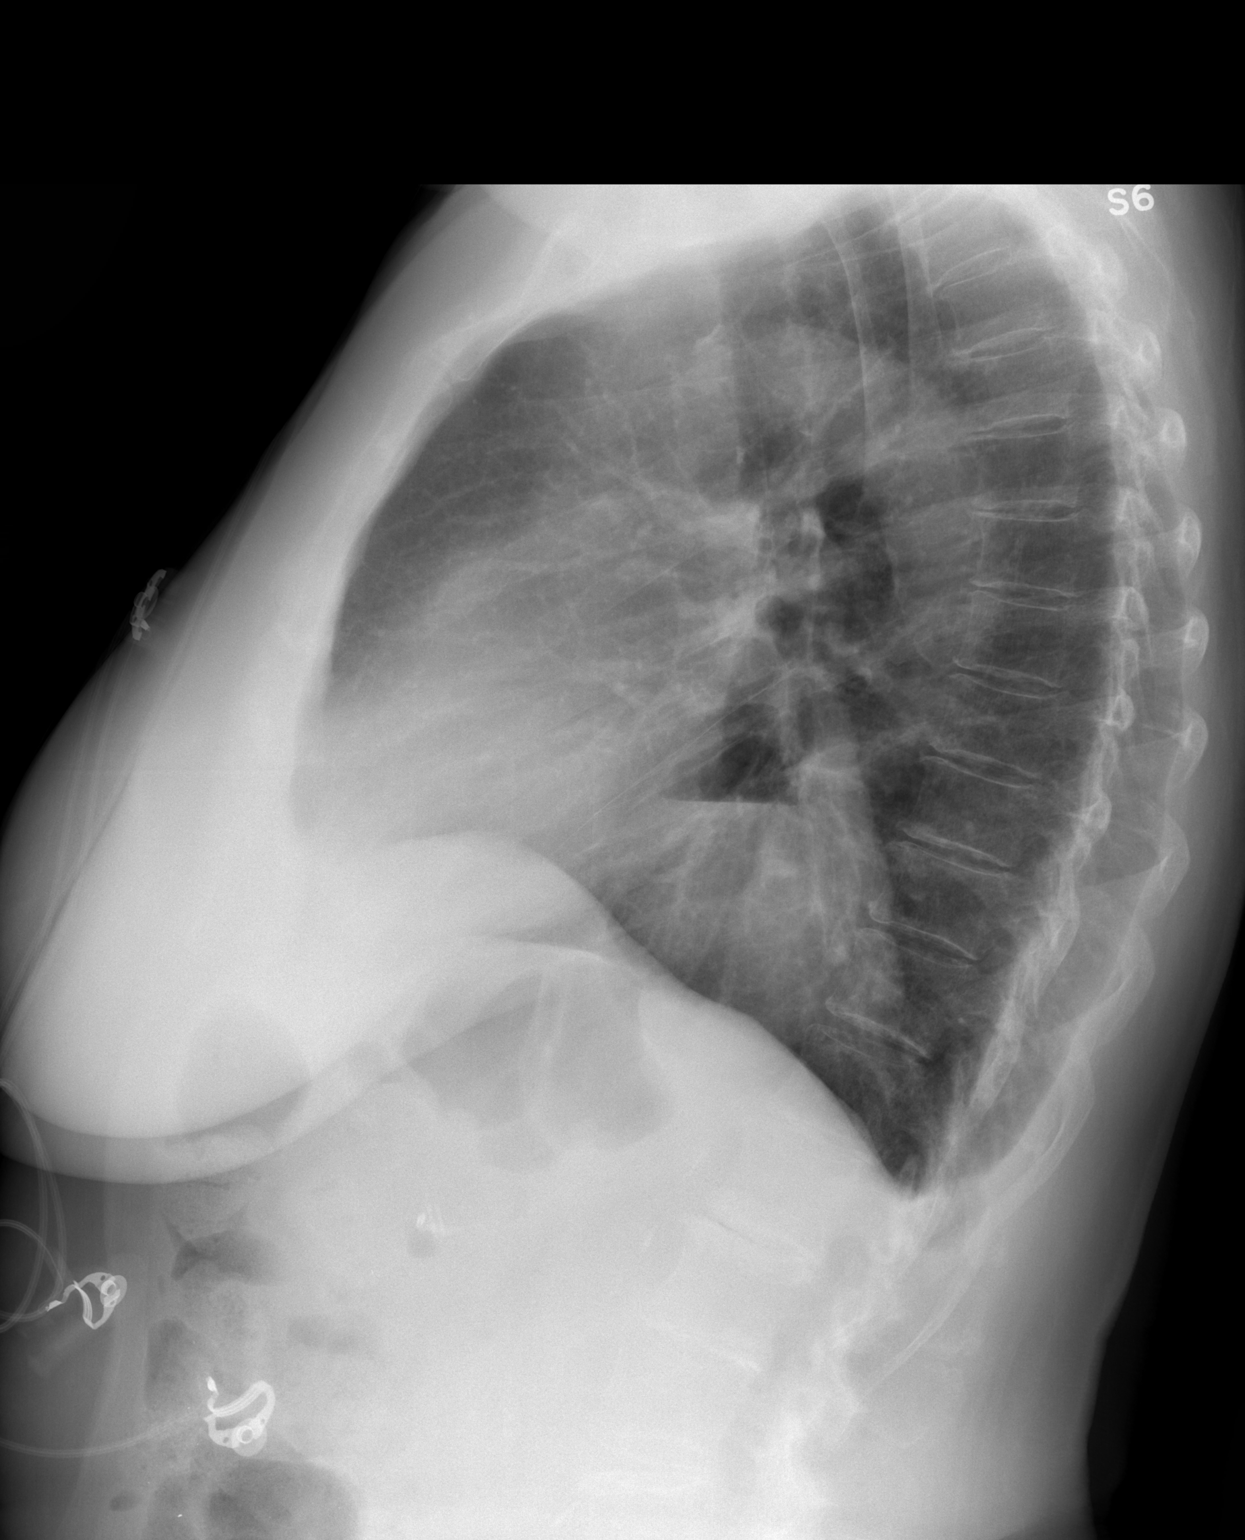

[2 of 2 positions shown; findings below may reference images not displayed]

FINDINGS: Stable normal size cardiac silhouette. Re- demonstration of a
retrocardiac density with air-fluid level, most in keeping with a
moderate hiatal hernia. Mediastinal contour is stable with mild
tortuosity of the atherosclerotic thoracic aorta. No pneumothorax.
No pleural effusion. Clear lungs, with no pulmonary edema and no
focal lung consolidation to suggest a pneumonia. Mild degenerative
changes in the thoracic spine. Cholecystectomy clips in the right
upper quadrant of the abdomen.
IMPRESSION: 1. No active cardiopulmonary disease. No focal lung consolidation to
suggest a pneumonia.
2. Stable moderate hiatal hernia.

## 2017-11-27 ENCOUNTER — Other Ambulatory Visit: Payer: Self-pay | Admitting: Geriatric Medicine

## 2017-11-27 DIAGNOSIS — R1313 Dysphagia, pharyngeal phase: Secondary | ICD-10-CM

## 2017-12-02 ENCOUNTER — Ambulatory Visit
Admission: RE | Admit: 2017-12-02 | Discharge: 2017-12-02 | Disposition: A | Discharge status: Home / Self Care | Payer: PRIVATE HEALTH INSURANCE | Source: Ambulatory Visit | Attending: Geriatric Medicine | Admitting: Geriatric Medicine

## 2017-12-02 DIAGNOSIS — R1313 Dysphagia, pharyngeal phase: Secondary | ICD-10-CM

## 2018-04-04 ENCOUNTER — Other Ambulatory Visit: Payer: Self-pay | Admitting: Internal Medicine

## 2018-04-04 ENCOUNTER — Ambulatory Visit
Admission: RE | Admit: 2018-04-04 | Discharge: 2018-04-04 | Disposition: A | Discharge status: Home / Self Care | Payer: Medicare Other | Source: Ambulatory Visit | Attending: Internal Medicine | Admitting: Internal Medicine

## 2018-04-04 DIAGNOSIS — M7602 Gluteal tendinitis, left hip: Secondary | ICD-10-CM

## 2019-10-11 ENCOUNTER — Ambulatory Visit (INDEPENDENT_AMBULATORY_CARE_PROVIDER_SITE_OTHER)
Admission: EM | Admit: 2019-10-11 | Discharge: 2019-10-11 | Disposition: A | Discharge status: Home / Self Care | Payer: Medicare PPO | Source: Home / Self Care | Attending: Emergency Medicine | Admitting: Emergency Medicine

## 2019-10-11 ENCOUNTER — Emergency Department (HOSPITAL_COMMUNITY)
Admission: EM | Admit: 2019-10-11 | Discharge: 2019-10-11 | Disposition: A | Discharge status: Home / Self Care | Payer: Medicare PPO | Attending: Emergency Medicine | Admitting: Emergency Medicine

## 2019-10-11 ENCOUNTER — Encounter (HOSPITAL_COMMUNITY): Payer: Self-pay | Admitting: Emergency Medicine

## 2019-10-11 ENCOUNTER — Other Ambulatory Visit: Payer: Self-pay

## 2019-10-11 ENCOUNTER — Encounter (HOSPITAL_COMMUNITY): Payer: Self-pay

## 2019-10-11 ENCOUNTER — Emergency Department (HOSPITAL_COMMUNITY): Payer: Medicare PPO

## 2019-10-11 DIAGNOSIS — W01198A Fall on same level from slipping, tripping and stumbling with subsequent striking against other object, initial encounter: Secondary | ICD-10-CM | POA: Diagnosis not present

## 2019-10-11 DIAGNOSIS — Y9389 Activity, other specified: Secondary | ICD-10-CM | POA: Diagnosis not present

## 2019-10-11 DIAGNOSIS — Z79899 Other long term (current) drug therapy: Secondary | ICD-10-CM | POA: Diagnosis not present

## 2019-10-11 DIAGNOSIS — I10 Essential (primary) hypertension: Secondary | ICD-10-CM | POA: Insufficient documentation

## 2019-10-11 DIAGNOSIS — S0990XA Unspecified injury of head, initial encounter: Secondary | ICD-10-CM | POA: Diagnosis present

## 2019-10-11 DIAGNOSIS — W19XXXA Unspecified fall, initial encounter: Secondary | ICD-10-CM

## 2019-10-11 DIAGNOSIS — Y9289 Other specified places as the place of occurrence of the external cause: Secondary | ICD-10-CM | POA: Insufficient documentation

## 2019-10-11 DIAGNOSIS — Y999 Unspecified external cause status: Secondary | ICD-10-CM | POA: Insufficient documentation

## 2019-10-11 DIAGNOSIS — S022XXA Fracture of nasal bones, initial encounter for closed fracture: Secondary | ICD-10-CM | POA: Diagnosis not present

## 2019-10-11 DIAGNOSIS — H6121 Impacted cerumen, right ear: Secondary | ICD-10-CM | POA: Diagnosis not present

## 2019-10-11 DIAGNOSIS — S0081XA Abrasion of other part of head, initial encounter: Secondary | ICD-10-CM

## 2019-10-11 MED ORDER — MUPIROCIN 2 % EX OINT: 1.0000 "application " | TOPICAL_OINTMENT | Freq: Three times a day (TID) | CUTANEOUS | 0 refills | Status: AC

## 2019-10-11 NOTE — ED Provider Notes (Signed)
[place] EMERGENCY DEPARTMENT Provider Note   CSN: [id number] Arrival date & time: 10/11/19  1433     History No chief complaint on file.   Chloe Wong is a 86 y.o. female.  HPI Patient presents with facial injury after fall.  Was at the store and tripped and fell striking her face.  No loss conscious.  Complaining of pain in her nose slightly on her forehead.  States tetanus is up-to-date.  No neck pain.  No loss consciousness.  States she did land on her left leg but states it really does not hurt.  Has been ambulatory since.  No chest or abdominal pain.    Past Medical History:  Diagnosis Date  . Anemia   . Arthritis    right leg  . Depression   . GERD (gastroesophageal reflux disease)   . Hyperlipidemia   . Urine, incontinence, stress female     Patient Active Problem List   Diagnosis Date Noted  . Sepsis ([place]) 06/12/2015  . Anemia 06/12/2015  . Sepsis secondary to UTI ([place])   . Symptomatic anemia 05/17/2015  . Incontinence in female 05/17/2015  . HLD (hyperlipidemia) 05/17/2015  . GERD without esophagitis 05/17/2015  . Depression 05/17/2015  . UTI (lower urinary tract infection) 10/14/2013  . Acute encephalopathy 10/14/2013  . HTN (hypertension) 10/14/2013  . Encephalopathy acute 10/14/2013    Past Surgical History:  Procedure Laterality Date  . bladder lift  9 yrs ago  . CHOLECYSTECTOMY  20 yrs ago  . COLONOSCOPY WITH PROPOFOL N/A 07/07/2013   Procedure: COLONOSCOPY WITH PROPOFOL;  Surgeon: Garlan Fair, MD;  Location: [facility];  Service: Endoscopy;  Laterality: N/A;  . ESOPHAGOGASTRODUODENOSCOPY (EGD) WITH PROPOFOL N/A 07/07/2013   Procedure: ESOPHAGOGASTRODUODENOSCOPY (EGD) WITH PROPOFOL;  Surgeon: Garlan Fair, MD;  Location: [facility];  Service: Endoscopy;  Laterality: N/A;     OB History   No obstetric history on file.     Family History  Problem Relation Age of Onset  . Heart disease Father   . Cancer  Mother     Social History   Tobacco Use  . Smoking status: Never Smoker  . Smokeless tobacco: Never Used  Substance Use Topics  . Alcohol use: No  . Drug use: No    Home Medications Prior to Admission medications   Medication Sig Start Date End Date Taking? Authorizing Provider  citalopram (CELEXA) 10 MG tablet Take 10 mg by mouth daily. 05/05/15   [provider]  iron polysaccharides (NIFEREX) 150 MG capsule Take 1 capsule (150 mg total) by mouth daily. 06/17/15   Regalado, Belkys A, MD  loratadine (CLARITIN) 10 MG tablet Take 10 mg by mouth daily.    [provider]  Multiple Vitamin (MULTIVITAMIN WITH MINERALS) TABS tablet Take 1 tablet by mouth 2 (two) times daily.     [provider]  mupirocin ointment (BACTROBAN) 2 % Apply 1 application topically 3 (three) times daily. 10/11/19   Melynda Ripple, MD  MYRBETRIQ 50 MG TB24 tablet Take 50 mg by mouth daily. 05/05/15   [provider]  omeprazole (PRILOSEC) 20 MG capsule Take 20 mg by mouth daily.    [provider]  pravastatin (PRAVACHOL) 10 MG tablet Take 10 mg by mouth daily. 04/15/15   [provider]  primidone (MYSOLINE) 50 MG tablet Take 50 mg by mouth at bedtime. 09/25/19   [provider]    Allergies    Patient has no known allergies.  Review of Systems   Review of Systems  Constitutional: Negative for appetite change.  HENT:       Nasal pain.  Respiratory: Negative for shortness of breath.   Musculoskeletal: Negative for back pain and neck pain.  Skin: Negative for rash.  Neurological: Negative for headaches.  Psychiatric/Behavioral: Negative for confusion.    Physical Exam Updated Vital Signs BP (!) 158/75 (BP Location: Right Arm)   Pulse 75   Temp 98.3 F (36.8 C) (Oral)   Resp 16   SpO2 100%   Physical Exam Vitals reviewed.  HENT:     Head:     Comments: Abrasion to left forehead.  Abrasion to bridge of nose with some underlying bony  tenderness. Eyes:     Extraocular Movements: Extraocular movements intact.     Pupils: Pupils are equal, round, and reactive to light.  Neck:     Comments: No midline tenderness. Cardiovascular:     Rate and Rhythm: Regular rhythm.  Pulmonary:     Breath sounds: No wheezing or rhonchi.  Chest:     Chest wall: No tenderness.  Abdominal:     Tenderness: There is no abdominal tenderness.  Musculoskeletal:        General: No tenderness.     Cervical back: Neck supple.  Skin:    General: Skin is warm.     Capillary Refill: Capillary refill takes less than 2 seconds.  Neurological:     Mental Status: She is alert and oriented to person, place, and time.     ED Results / Procedures / Treatments   Labs (all labs ordered are listed, but only abnormal results are displayed) Labs Reviewed - No data to display  EKG None  Radiology CT Head Wo Contrast  Result Date: 10/11/2019 CLINICAL DATA:  Status post fall while getting out of the car. EXAM: CT HEAD WITHOUT CONTRAST CT MAXILLOFACIAL WITHOUT CONTRAST TECHNIQUE: Multidetector CT imaging of the head and maxillofacial structures were performed using the standard protocol without intravenous contrast. Multiplanar CT image reconstructions of the maxillofacial structures were also generated. COMPARISON:  10/14/2013 FINDINGS: CT HEAD FINDINGS Brain: No evidence of acute infarction, hemorrhage, extra-axial collection, ventriculomegaly, or mass effect. Generalized cerebral atrophy. Periventricular white matter low attenuation likely secondary to microangiopathy. Vascular: Cerebrovascular atherosclerotic calcifications are noted. Skull: Negative for fracture or focal lesion. Sinuses/Orbits: Visualized portions of the orbits are unremarkable. Visualized portions of the paranasal sinuses and mastoid air cells are unremarkable. Other: None. CT MAXILLOFACIAL FINDINGS Osseous: Nondisplaced right nasal bone fracture with overlying soft tissue swelling. No  other acute fracture or dislocation. Temporomandibular joints are normal. Bilateral facet arthropathy at C2-3 and C3-4. Bilateral foraminal stenosis at C3-4. Orbits: Negative. No traumatic or inflammatory finding. Sinuses: Clear. Soft tissues: Soft tissue swelling over the nose. Otherwise no soft tissue abnormality. No fluid collection or hematoma. IMPRESSION: 1. No acute intracranial pathology. 2. Nondisplaced fracture of the right nasal bone. Electronically Signed   By: Elige Ko   On: 10/11/2019 15:43   CT Maxillofacial Wo Contrast  Result Date: 10/11/2019 CLINICAL DATA:  Status post fall while getting out of the car. EXAM: CT HEAD WITHOUT CONTRAST CT MAXILLOFACIAL WITHOUT CONTRAST TECHNIQUE: Multidetector CT imaging of the head and maxillofacial structures were performed using the standard protocol without intravenous contrast. Multiplanar CT image reconstructions of the maxillofacial structures were also generated. COMPARISON:  10/14/2013 FINDINGS: CT HEAD FINDINGS Brain: No evidence of acute infarction, hemorrhage, extra-axial collection, ventriculomegaly, or mass effect. Generalized cerebral atrophy. Periventricular  white matter low attenuation likely secondary to microangiopathy. Vascular: Cerebrovascular atherosclerotic calcifications are noted. Skull: Negative for fracture or focal lesion. Sinuses/Orbits: Visualized portions of the orbits are unremarkable. Visualized portions of the paranasal sinuses and mastoid air cells are unremarkable. Other: None. CT MAXILLOFACIAL FINDINGS Osseous: Nondisplaced right nasal bone fracture with overlying soft tissue swelling. No other acute fracture or dislocation. Temporomandibular joints are normal. Bilateral facet arthropathy at C2-3 and C3-4. Bilateral foraminal stenosis at C3-4. Orbits: Negative. No traumatic or inflammatory finding. Sinuses: Clear. Soft tissues: Soft tissue swelling over the nose. Otherwise no soft tissue abnormality. No fluid collection or  hematoma. IMPRESSION: 1. No acute intracranial pathology. 2. Nondisplaced fracture of the right nasal bone. Electronically Signed   By: Elige Ko   On: 10/11/2019 15:43    Procedures Procedures (including critical care time)  Medications Ordered in ED Medications - No data to display  ED Course  I have reviewed the triage vital signs and the nursing notes.  Pertinent labs & imaging results that were available during my care of the patient were reviewed by me and considered in my medical decision making (see chart for details).    MDM Rules/Calculators/A&P                      Patient with fall.  Nasal bone fracture.  Abrasion/laceration on bridge of nose but does not appear to go all the way through the skin.  Did not appear to be an open fracture.  Abrasion to head but head CT reassuring.  Discharge home with ENT follow-up Final Clinical Impression(s) / ED Diagnoses Final diagnoses:  Fall, initial encounter  Closed fracture of nasal bone, initial encounter  Abrasion of face, initial encounter    Rx / DC Orders ED Discharge Orders    None       Benjiman Core, MD 10/11/19 [phone]

## 2019-10-11 NOTE — ED Notes (Signed)
Patient verbalizes understanding of discharge instructions. Opportunity for questioning and answers were provided. Armband removed by staff, pt discharged from ED via wheelchair w/ son  

## 2019-10-11 NOTE — Discharge Instructions (Addendum)
I am recommending that you go to the ED for head CT.  You may apply Bactroban to the abrasions as they heal.  Unfortunately there is nothing to sew, we will have to let it heal by itself.  Follow-up with your doctor as needed, return to the ED for severe headache, nausea, vomiting, altered mental status or other concerns.

## 2019-10-11 NOTE — ED Triage Notes (Signed)
Patient fell while walking to car with groceries striking face on pavement. Laceration and abrasion to nose and forehead, denies loc.

## 2019-10-11 NOTE — ED Provider Notes (Signed)
HPI  SUBJECTIVE:  Chloe Wong is a 86 y.o. female who presents with facial and nasal bridge abrasions after having a mechanical fall at 12:00 today.  Patient states that she was pushing a grocery cart up on the curb, lost her balance, fell forward and hit her face.  She denies preceding chest pain, palpitation, dizziness, syncope causing the fall.  She denies loss of consciousness, headache, nausea, vomiting, visual changes, arm or leg weakness, numbness or tingling in her extremities, slurred speech.  She reports mild burning nasal bridge pain and epistaxis.  No aggravating or alleviating factors.  She has not tried anything for this.  She is not on any anticoagulant or antiplatelet therapy, no history of osteoporosis.  HDQ:[id number], Hal, MD   Past Medical History:  Diagnosis Date  . Anemia   . Arthritis    right leg  . Depression   . GERD (gastroesophageal reflux disease)   . Hyperlipidemia   . Urine, incontinence, stress female     Past Surgical History:  Procedure Laterality Date  . bladder lift  9 yrs ago  . CHOLECYSTECTOMY  20 yrs ago  . COLONOSCOPY WITH PROPOFOL N/A 07/07/2013   Procedure: COLONOSCOPY WITH PROPOFOL;  Surgeon: Charolett Bumpers, MD;  Location: [facility];  Service: Endoscopy;  Laterality: N/A;  . ESOPHAGOGASTRODUODENOSCOPY (EGD) WITH PROPOFOL N/A 07/07/2013   Procedure: ESOPHAGOGASTRODUODENOSCOPY (EGD) WITH PROPOFOL;  Surgeon: Charolett Bumpers, MD;  Location: [facility];  Service: Endoscopy;  Laterality: N/A;    Family History  Problem Relation Age of Onset  . Heart disease Father   . Cancer Mother     Social History   Tobacco Use  . Smoking status: Never Smoker  . Smokeless tobacco: Never Used  Substance Use Topics  . Alcohol use: No  . Drug use: No    No current facility-administered medications for this encounter.  Current Outpatient Medications:  .  citalopram (CELEXA) 10 MG tablet, Take 10 mg by mouth daily., Disp: , Rfl:  .  iron  polysaccharides (NIFEREX) 150 MG capsule, Take 1 capsule (150 mg total) by mouth daily., Disp: 30 capsule, Rfl: 3 .  loratadine (CLARITIN) 10 MG tablet, Take 10 mg by mouth daily., Disp: , Rfl:  .  Multiple Vitamin (MULTIVITAMIN WITH MINERALS) TABS tablet, Take 1 tablet by mouth 2 (two) times daily. , Disp: , Rfl:  .  mupirocin ointment (BACTROBAN) 2 %, Apply 1 application topically 3 (three) times daily., Disp: 22 g, Rfl: 0 .  MYRBETRIQ 50 MG TB24 tablet, Take 50 mg by mouth daily., Disp: , Rfl:  .  omeprazole (PRILOSEC) 20 MG capsule, Take 20 mg by mouth daily., Disp: , Rfl:  .  pravastatin (PRAVACHOL) 10 MG tablet, Take 10 mg by mouth daily., Disp: , Rfl:  .  primidone (MYSOLINE) 50 MG tablet, Take 50 mg by mouth at bedtime., Disp: , Rfl:   No Known Allergies   ROS  As noted in HPI.   Physical Exam  BP (!) 161/88   Pulse 82   Temp 98.2 F (36.8 C)   Resp 18   SpO2 98%   Constitutional: Well developed, well nourished, no acute distress Eyes: PERRLA, EOMI, conjunctiva normal bilaterally.  No direct or consensual photophobia HENT: Normocephalic,mucus membranes moist. left TM normal.  No hemotympanum.  Right TM obscured with cerumen.  No epistaxis.  Septum midline.  No septal hematoma.  Positive abrasion over bridge of nose and forehead.  No other facial bony tenderness.  No orbital  rim tenderness.  No periorbital ecchymosis.  Negative battle sign.         Respiratory: Normal inspiratory effort Cardiovascular: Normal rate GI: nondistended skin: No rash, skin intact Musculoskeletal: no deformities Neurologic: Alert & oriented x 3, no focal neuro deficits.  GCS 15.   Psychiatric: Speech and behavior appropriate   ED Course   Medications - No data to display  Orders Placed This Encounter  Procedures  . Ear wax removal    Right ear only    Standing Status:   Standing    Number of Occurrences:   1    No results found for this or any previous visit (from the past 24  hour(s)). No results found.  ED Clinical Impression  1. Fall, initial encounter   2. Abrasion of face, initial encounter   3. Injury of head, initial encounter   4. Impacted cerumen of right ear      ED Assessment/Plan  Attempted to curette out cerumen without success.  We will have irrigated.  Will reevaluate.  Post right ear irrigated irrigation, no hemotympanum.  Patient requires head CT per guidelines given her age.  Feel that she is stable to go via private vehicle.  Discussed this with patient and son.  They agree to go to the ED.  There is nothing to sew.  We will have to let the abrasions heal by secondary intention.  Home with Bactroban.  Discussed medical decision making rationale for transfer to the emergency department.  Patient agrees with plan.  Meds ordered this encounter  Medications  . mupirocin ointment (BACTROBAN) 2 %    Sig: Apply 1 application topically 3 (three) times daily.    Dispense:  22 g    Refill:  0    *This clinic note was created using [employer]. Therefore, there may be occasional mistakes despite careful proofreading.   ?   Melynda Ripple, MD 10/11/19 1437

## 2019-10-11 NOTE — ED Triage Notes (Signed)
Pt states she was getting out of her car around noon today and tripped over a curb and hit her face on the ground. Pt has quarter size abrasion to forehead. Small laceration to bridge of nose. Pt denies LOC. AAOX4.

## 2020-02-11 DIAGNOSIS — Z79899 Other long term (current) drug therapy: Secondary | ICD-10-CM | POA: Diagnosis not present

## 2020-02-11 DIAGNOSIS — R5383 Other fatigue: Secondary | ICD-10-CM | POA: Diagnosis not present

## 2020-02-11 DIAGNOSIS — F411 Generalized anxiety disorder: Secondary | ICD-10-CM | POA: Diagnosis not present

## 2020-02-11 DIAGNOSIS — K591 Functional diarrhea: Secondary | ICD-10-CM | POA: Diagnosis not present

## 2020-03-25 DIAGNOSIS — N3946 Mixed incontinence: Secondary | ICD-10-CM | POA: Diagnosis not present

## 2020-04-20 DIAGNOSIS — R300 Dysuria: Secondary | ICD-10-CM | POA: Diagnosis not present

## 2020-08-09 DIAGNOSIS — E7800 Pure hypercholesterolemia, unspecified: Secondary | ICD-10-CM | POA: Diagnosis not present

## 2020-08-09 DIAGNOSIS — K219 Gastro-esophageal reflux disease without esophagitis: Secondary | ICD-10-CM | POA: Diagnosis not present

## 2020-08-09 DIAGNOSIS — Z79899 Other long term (current) drug therapy: Secondary | ICD-10-CM | POA: Diagnosis not present

## 2020-08-09 DIAGNOSIS — Z0000 Encounter for general adult medical examination without abnormal findings: Secondary | ICD-10-CM | POA: Diagnosis not present

## 2020-08-09 DIAGNOSIS — Z23 Encounter for immunization: Secondary | ICD-10-CM | POA: Diagnosis not present

## 2020-08-09 DIAGNOSIS — Z1389 Encounter for screening for other disorder: Secondary | ICD-10-CM | POA: Diagnosis not present

## 2020-08-09 DIAGNOSIS — G250 Essential tremor: Secondary | ICD-10-CM | POA: Diagnosis not present

## 2020-08-09 DIAGNOSIS — K909 Intestinal malabsorption, unspecified: Secondary | ICD-10-CM | POA: Diagnosis not present

## 2020-08-25 DIAGNOSIS — N3946 Mixed incontinence: Secondary | ICD-10-CM | POA: Diagnosis not present

## 2020-08-25 DIAGNOSIS — R350 Frequency of micturition: Secondary | ICD-10-CM | POA: Diagnosis not present

## 2020-08-25 DIAGNOSIS — F980 Enuresis not due to a substance or known physiological condition: Secondary | ICD-10-CM | POA: Diagnosis not present

## 2020-11-28 DIAGNOSIS — Z20828 Contact with and (suspected) exposure to other viral communicable diseases: Secondary | ICD-10-CM | POA: Diagnosis not present

## 2020-11-28 DIAGNOSIS — Z1159 Encounter for screening for other viral diseases: Secondary | ICD-10-CM | POA: Diagnosis not present

## 2020-12-05 DIAGNOSIS — Z20828 Contact with and (suspected) exposure to other viral communicable diseases: Secondary | ICD-10-CM | POA: Diagnosis not present

## 2020-12-05 DIAGNOSIS — Z1159 Encounter for screening for other viral diseases: Secondary | ICD-10-CM | POA: Diagnosis not present

## 2020-12-12 DIAGNOSIS — Z20828 Contact with and (suspected) exposure to other viral communicable diseases: Secondary | ICD-10-CM | POA: Diagnosis not present

## 2020-12-12 DIAGNOSIS — Z1159 Encounter for screening for other viral diseases: Secondary | ICD-10-CM | POA: Diagnosis not present

## 2020-12-19 DIAGNOSIS — Z1159 Encounter for screening for other viral diseases: Secondary | ICD-10-CM | POA: Diagnosis not present

## 2020-12-19 DIAGNOSIS — Z20828 Contact with and (suspected) exposure to other viral communicable diseases: Secondary | ICD-10-CM | POA: Diagnosis not present

## 2020-12-26 DIAGNOSIS — Z20828 Contact with and (suspected) exposure to other viral communicable diseases: Secondary | ICD-10-CM | POA: Diagnosis not present

## 2020-12-26 DIAGNOSIS — Z1159 Encounter for screening for other viral diseases: Secondary | ICD-10-CM | POA: Diagnosis not present

## 2021-01-02 DIAGNOSIS — Z20828 Contact with and (suspected) exposure to other viral communicable diseases: Secondary | ICD-10-CM | POA: Diagnosis not present

## 2021-01-02 DIAGNOSIS — Z1159 Encounter for screening for other viral diseases: Secondary | ICD-10-CM | POA: Diagnosis not present

## 2021-01-09 DIAGNOSIS — Z1159 Encounter for screening for other viral diseases: Secondary | ICD-10-CM | POA: Diagnosis not present

## 2021-01-09 DIAGNOSIS — Z20828 Contact with and (suspected) exposure to other viral communicable diseases: Secondary | ICD-10-CM | POA: Diagnosis not present

## 2021-01-16 DIAGNOSIS — Z1159 Encounter for screening for other viral diseases: Secondary | ICD-10-CM | POA: Diagnosis not present

## 2021-01-16 DIAGNOSIS — Z20828 Contact with and (suspected) exposure to other viral communicable diseases: Secondary | ICD-10-CM | POA: Diagnosis not present

## 2021-01-23 DIAGNOSIS — Z20828 Contact with and (suspected) exposure to other viral communicable diseases: Secondary | ICD-10-CM | POA: Diagnosis not present

## 2021-01-23 DIAGNOSIS — Z1159 Encounter for screening for other viral diseases: Secondary | ICD-10-CM | POA: Diagnosis not present

## 2021-01-30 DIAGNOSIS — Z20828 Contact with and (suspected) exposure to other viral communicable diseases: Secondary | ICD-10-CM | POA: Diagnosis not present

## 2021-01-30 DIAGNOSIS — Z1159 Encounter for screening for other viral diseases: Secondary | ICD-10-CM | POA: Diagnosis not present

## 2021-02-06 DIAGNOSIS — Z20828 Contact with and (suspected) exposure to other viral communicable diseases: Secondary | ICD-10-CM | POA: Diagnosis not present

## 2021-02-06 DIAGNOSIS — Z1159 Encounter for screening for other viral diseases: Secondary | ICD-10-CM | POA: Diagnosis not present

## 2021-02-13 DIAGNOSIS — Z20828 Contact with and (suspected) exposure to other viral communicable diseases: Secondary | ICD-10-CM | POA: Diagnosis not present

## 2021-02-13 DIAGNOSIS — Z1159 Encounter for screening for other viral diseases: Secondary | ICD-10-CM | POA: Diagnosis not present

## 2021-02-14 DIAGNOSIS — Z79899 Other long term (current) drug therapy: Secondary | ICD-10-CM | POA: Diagnosis not present

## 2021-02-14 DIAGNOSIS — K219 Gastro-esophageal reflux disease without esophagitis: Secondary | ICD-10-CM | POA: Diagnosis not present

## 2021-02-14 DIAGNOSIS — H903 Sensorineural hearing loss, bilateral: Secondary | ICD-10-CM | POA: Diagnosis not present

## 2021-02-20 DIAGNOSIS — Z20828 Contact with and (suspected) exposure to other viral communicable diseases: Secondary | ICD-10-CM | POA: Diagnosis not present

## 2021-02-20 DIAGNOSIS — Z1159 Encounter for screening for other viral diseases: Secondary | ICD-10-CM | POA: Diagnosis not present

## 2021-02-21 DIAGNOSIS — G3184 Mild cognitive impairment, so stated: Secondary | ICD-10-CM | POA: Diagnosis not present

## 2021-02-21 DIAGNOSIS — F411 Generalized anxiety disorder: Secondary | ICD-10-CM | POA: Diagnosis not present

## 2021-02-27 DIAGNOSIS — Z1159 Encounter for screening for other viral diseases: Secondary | ICD-10-CM | POA: Diagnosis not present

## 2021-02-27 DIAGNOSIS — Z20828 Contact with and (suspected) exposure to other viral communicable diseases: Secondary | ICD-10-CM | POA: Diagnosis not present

## 2021-03-06 DIAGNOSIS — Z1159 Encounter for screening for other viral diseases: Secondary | ICD-10-CM | POA: Diagnosis not present

## 2021-03-06 DIAGNOSIS — Z20828 Contact with and (suspected) exposure to other viral communicable diseases: Secondary | ICD-10-CM | POA: Diagnosis not present

## 2021-03-13 DIAGNOSIS — Z1159 Encounter for screening for other viral diseases: Secondary | ICD-10-CM | POA: Diagnosis not present

## 2021-03-13 DIAGNOSIS — Z20828 Contact with and (suspected) exposure to other viral communicable diseases: Secondary | ICD-10-CM | POA: Diagnosis not present

## 2021-03-20 DIAGNOSIS — Z20828 Contact with and (suspected) exposure to other viral communicable diseases: Secondary | ICD-10-CM | POA: Diagnosis not present

## 2021-03-20 DIAGNOSIS — Z1159 Encounter for screening for other viral diseases: Secondary | ICD-10-CM | POA: Diagnosis not present

## 2021-03-27 DIAGNOSIS — Z20828 Contact with and (suspected) exposure to other viral communicable diseases: Secondary | ICD-10-CM | POA: Diagnosis not present

## 2021-03-27 DIAGNOSIS — Z1159 Encounter for screening for other viral diseases: Secondary | ICD-10-CM | POA: Diagnosis not present

## 2021-04-03 DIAGNOSIS — Z1159 Encounter for screening for other viral diseases: Secondary | ICD-10-CM | POA: Diagnosis not present

## 2021-04-03 DIAGNOSIS — Z20828 Contact with and (suspected) exposure to other viral communicable diseases: Secondary | ICD-10-CM | POA: Diagnosis not present

## 2021-04-10 DIAGNOSIS — Z1159 Encounter for screening for other viral diseases: Secondary | ICD-10-CM | POA: Diagnosis not present

## 2021-04-10 DIAGNOSIS — Z20828 Contact with and (suspected) exposure to other viral communicable diseases: Secondary | ICD-10-CM | POA: Diagnosis not present

## 2021-04-17 DIAGNOSIS — Z20828 Contact with and (suspected) exposure to other viral communicable diseases: Secondary | ICD-10-CM | POA: Diagnosis not present

## 2021-04-17 DIAGNOSIS — Z1159 Encounter for screening for other viral diseases: Secondary | ICD-10-CM | POA: Diagnosis not present

## 2021-04-26 DIAGNOSIS — Z20828 Contact with and (suspected) exposure to other viral communicable diseases: Secondary | ICD-10-CM | POA: Diagnosis not present

## 2021-05-01 DIAGNOSIS — Z8616 Personal history of COVID-19: Secondary | ICD-10-CM | POA: Diagnosis not present

## 2021-05-08 DIAGNOSIS — Z8616 Personal history of COVID-19: Secondary | ICD-10-CM | POA: Diagnosis not present

## 2021-05-15 DIAGNOSIS — Z8616 Personal history of COVID-19: Secondary | ICD-10-CM | POA: Diagnosis not present

## 2021-05-22 DIAGNOSIS — Z20828 Contact with and (suspected) exposure to other viral communicable diseases: Secondary | ICD-10-CM | POA: Diagnosis not present

## 2021-05-29 DIAGNOSIS — Z20828 Contact with and (suspected) exposure to other viral communicable diseases: Secondary | ICD-10-CM | POA: Diagnosis not present

## 2021-06-02 DIAGNOSIS — F3341 Major depressive disorder, recurrent, in partial remission: Secondary | ICD-10-CM | POA: Diagnosis not present

## 2021-06-02 DIAGNOSIS — F411 Generalized anxiety disorder: Secondary | ICD-10-CM | POA: Diagnosis not present

## 2021-06-02 DIAGNOSIS — D649 Anemia, unspecified: Secondary | ICD-10-CM | POA: Diagnosis not present

## 2021-06-02 DIAGNOSIS — G8929 Other chronic pain: Secondary | ICD-10-CM | POA: Diagnosis not present

## 2021-06-02 DIAGNOSIS — G3184 Mild cognitive impairment, so stated: Secondary | ICD-10-CM | POA: Diagnosis not present

## 2021-06-02 DIAGNOSIS — I10 Essential (primary) hypertension: Secondary | ICD-10-CM | POA: Diagnosis not present

## 2021-06-02 DIAGNOSIS — G250 Essential tremor: Secondary | ICD-10-CM | POA: Diagnosis not present

## 2021-06-02 DIAGNOSIS — E785 Hyperlipidemia, unspecified: Secondary | ICD-10-CM | POA: Diagnosis not present

## 2021-06-02 DIAGNOSIS — E663 Overweight: Secondary | ICD-10-CM | POA: Diagnosis not present

## 2021-06-05 DIAGNOSIS — Z20828 Contact with and (suspected) exposure to other viral communicable diseases: Secondary | ICD-10-CM | POA: Diagnosis not present

## 2021-06-12 DIAGNOSIS — Z20828 Contact with and (suspected) exposure to other viral communicable diseases: Secondary | ICD-10-CM | POA: Diagnosis not present

## 2021-06-19 DIAGNOSIS — Z8616 Personal history of COVID-19: Secondary | ICD-10-CM | POA: Diagnosis not present

## 2021-06-23 DIAGNOSIS — Z23 Encounter for immunization: Secondary | ICD-10-CM | POA: Diagnosis not present

## 2021-06-26 DIAGNOSIS — Z8616 Personal history of COVID-19: Secondary | ICD-10-CM | POA: Diagnosis not present

## 2021-07-02 IMAGING — CT CT MAXILLOFACIAL W/O CM
3 series · 15 of 47 positions shown, 18 images · non-contrast
Comparison: 10/14/2013

CLINICAL DATA: Status post fall while getting out of the car.

EXAM:
CT HEAD WITHOUT CONTRAST
CT MAXILLOFACIAL WITHOUT CONTRAST
TECHNIQUE: Multidetector CT imaging of the head and maxillofacial structures
were performed using the standard protocol without intravenous
contrast. Multiplanar CT image reconstructions of the maxillofacial
structures were also generated.

[Series 3: facialbone 2.0 (person_name) (person_name) · axial · 0.35mm/px · z∈[-208,-64]mm · 9 of 84 slices shown, 12 images]
[im 6/84  brain]
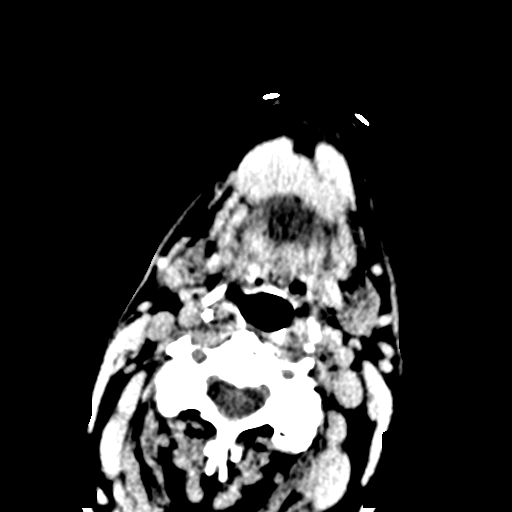
[im 6/84  bone]
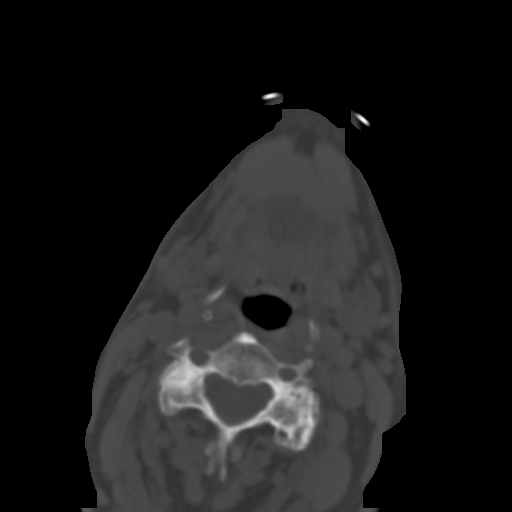
[im 15/84  bone]
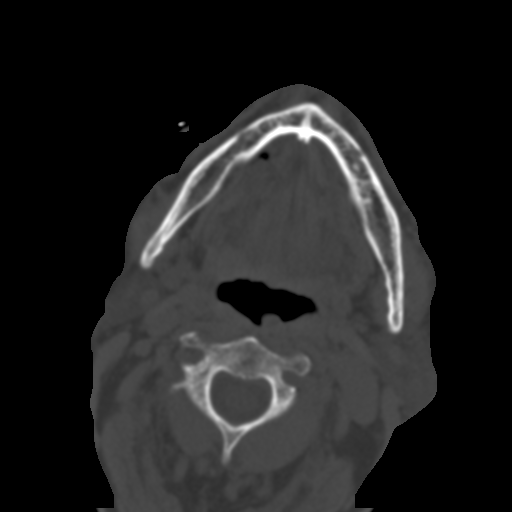
[im 23/84  bone]
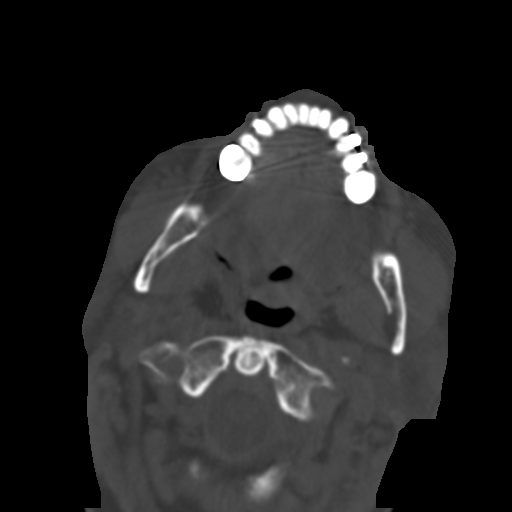
[im 32/84  bone]
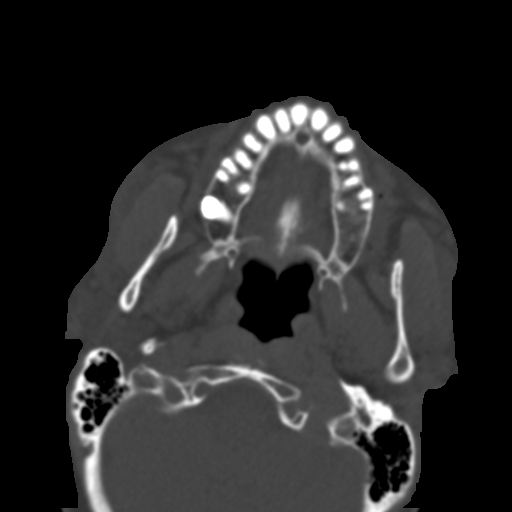
[im 43/84  brain]
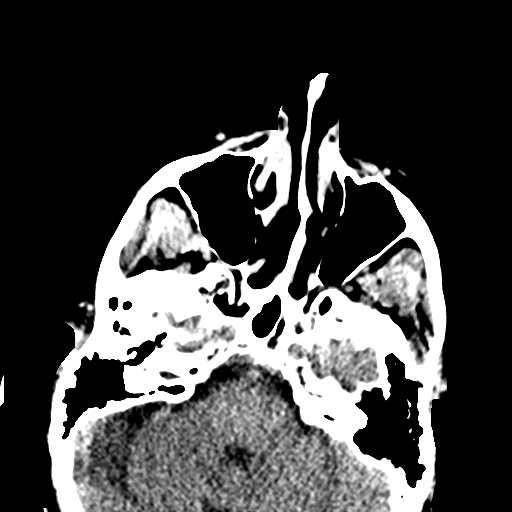
[im 43/84  bone]
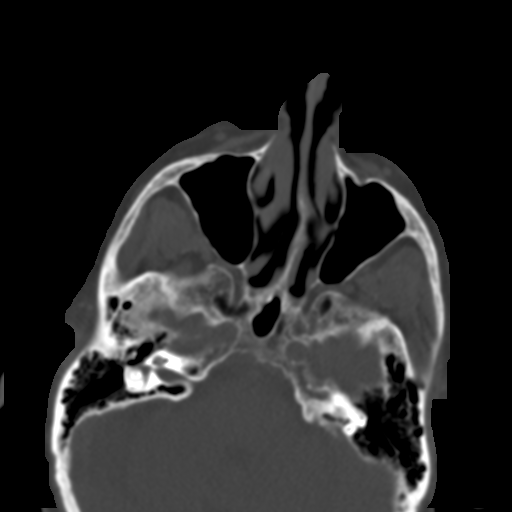
[im 52/84  bone]
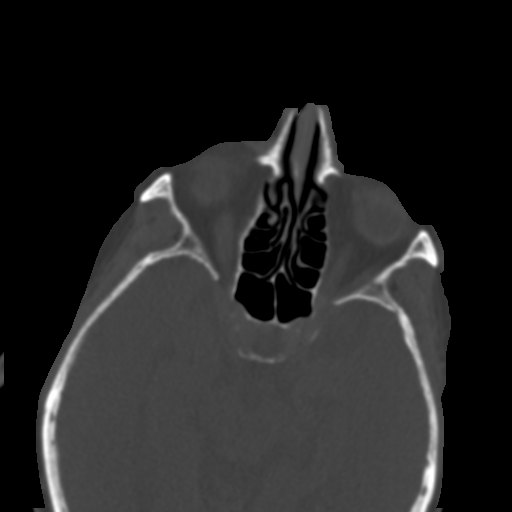
[im 61/84  bone]
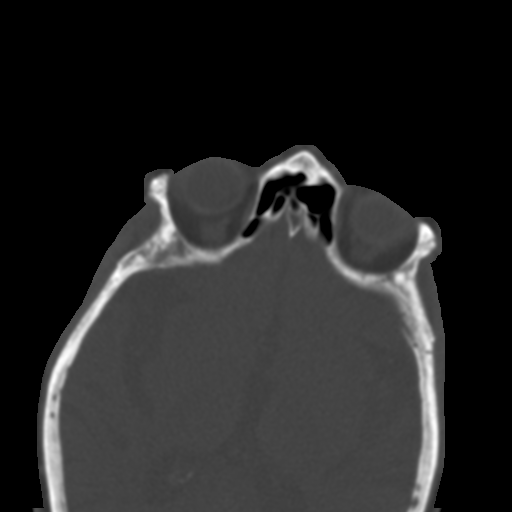
[im 69/84  bone]
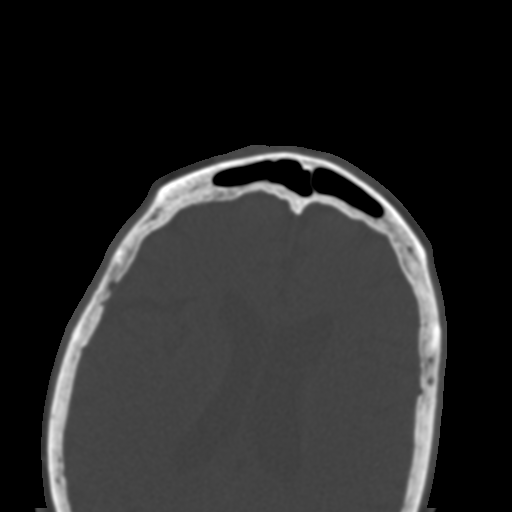
[im 78/84  brain]
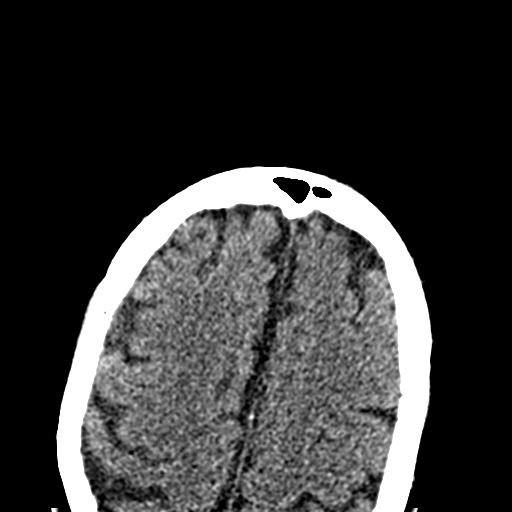
[im 78/84  bone]
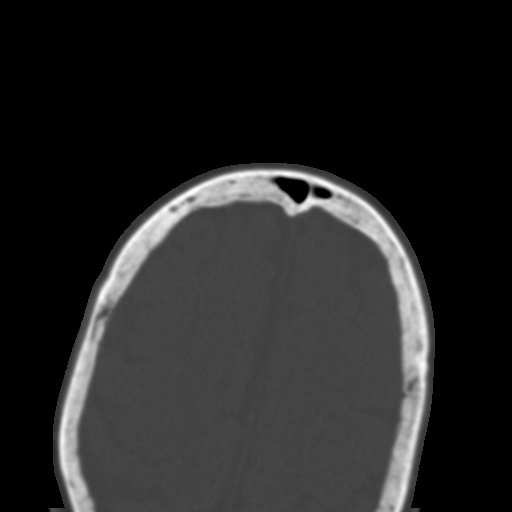

[Series 7: facialbone 2.0 cor st · coronal · 0.32mm/px · 3 of 81 slices shown]
[im 27/81  bone]
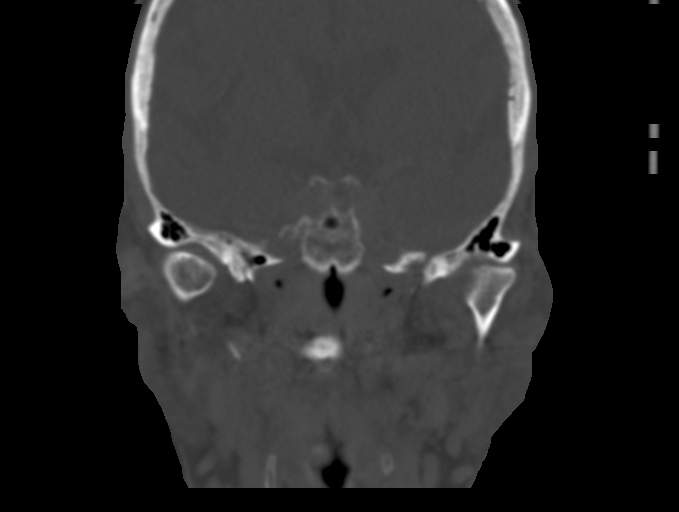
[im 36/81  bone]
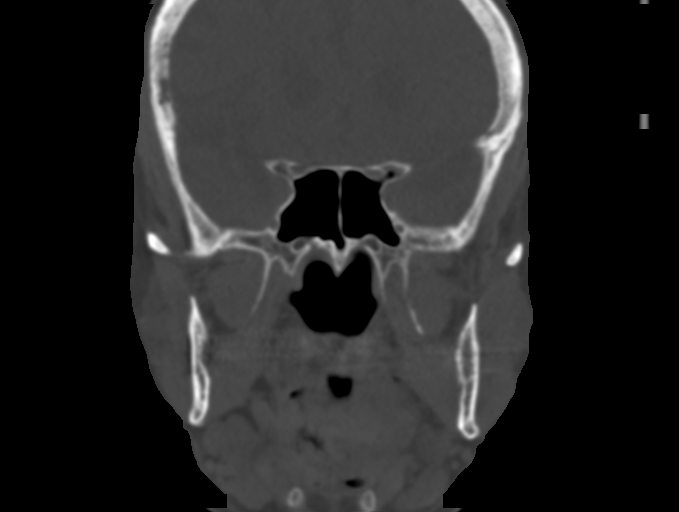
[im 45/81  bone]
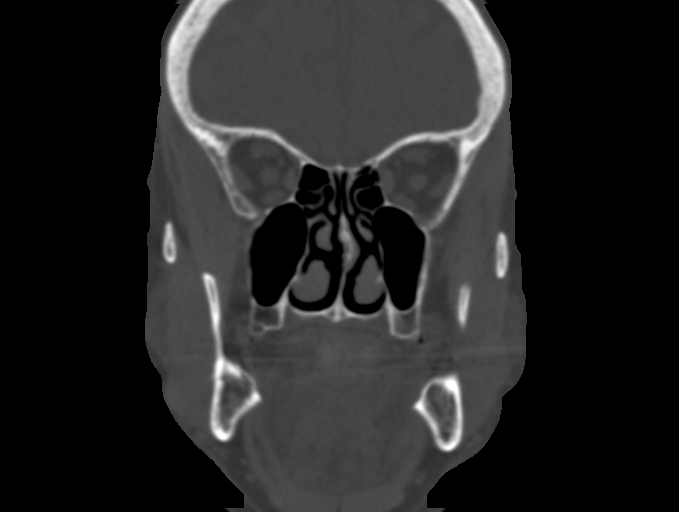

[Series 8: facialbone 2.0 sag st · sagittal · 0.36mm/px · 3 of 76 slices shown]
[im 26/76  bone]
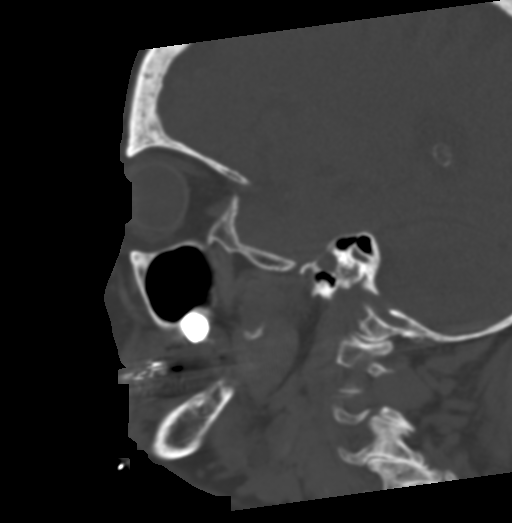
[im 38/76  bone]
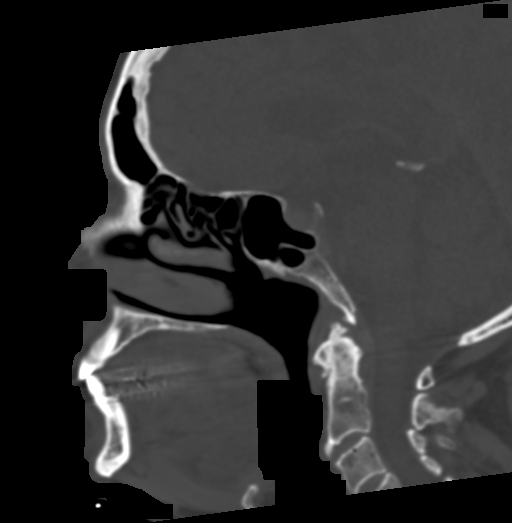
[im 51/76  bone]
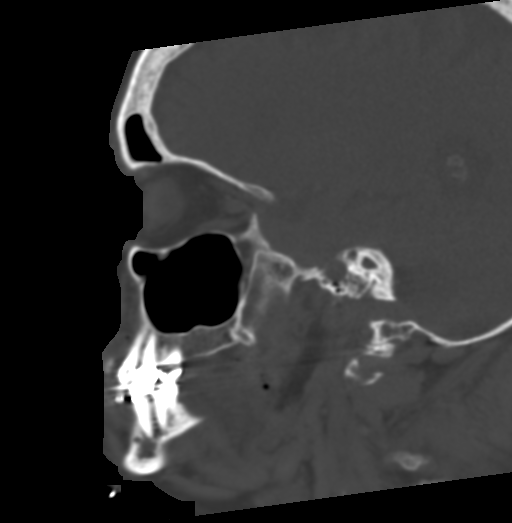

[15 of 47 positions shown; findings below may reference images not displayed]

FINDINGS: CT HEAD FINDINGS

Brain: No evidence of acute infarction, hemorrhage, extra-axial
collection, ventriculomegaly, or mass effect. Generalized cerebral
atrophy. Periventricular white matter low attenuation likely
secondary to microangiopathy.

Vascular: Cerebrovascular atherosclerotic calcifications are noted.

Skull: Negative for fracture or focal lesion.

Sinuses/Orbits: Visualized portions of the orbits are unremarkable.
Visualized portions of the paranasal sinuses and mastoid air cells
are unremarkable.

Other: None.

CT MAXILLOFACIAL FINDINGS

Osseous: Nondisplaced right nasal bone fracture with overlying soft
tissue swelling. No other acute fracture or dislocation.
Temporomandibular joints are normal. Bilateral facet arthropathy at
C2-3 and C3-4. Bilateral foraminal stenosis at C3-4.

Orbits: Negative. No traumatic or inflammatory finding.

Sinuses: Clear.

Soft tissues: Soft tissue swelling over the nose. Otherwise no soft
tissue abnormality. No fluid collection or hematoma.
IMPRESSION: 1. No acute intracranial pathology.
2. Nondisplaced fracture of the right nasal bone.

## 2021-07-03 DIAGNOSIS — Z20828 Contact with and (suspected) exposure to other viral communicable diseases: Secondary | ICD-10-CM | POA: Diagnosis not present

## 2021-07-10 DIAGNOSIS — Z8616 Personal history of COVID-19: Secondary | ICD-10-CM | POA: Diagnosis not present

## 2021-07-17 DIAGNOSIS — Z20828 Contact with and (suspected) exposure to other viral communicable diseases: Secondary | ICD-10-CM | POA: Diagnosis not present

## 2021-07-24 DIAGNOSIS — Z20828 Contact with and (suspected) exposure to other viral communicable diseases: Secondary | ICD-10-CM | POA: Diagnosis not present

## 2021-07-31 DIAGNOSIS — Z20822 Contact with and (suspected) exposure to covid-19: Secondary | ICD-10-CM | POA: Diagnosis not present

## 2021-08-07 DIAGNOSIS — Z20822 Contact with and (suspected) exposure to covid-19: Secondary | ICD-10-CM | POA: Diagnosis not present

## 2021-08-14 DIAGNOSIS — Z20828 Contact with and (suspected) exposure to other viral communicable diseases: Secondary | ICD-10-CM | POA: Diagnosis not present

## 2021-08-14 DIAGNOSIS — Z1159 Encounter for screening for other viral diseases: Secondary | ICD-10-CM | POA: Diagnosis not present

## 2021-08-21 DIAGNOSIS — Z1389 Encounter for screening for other disorder: Secondary | ICD-10-CM | POA: Diagnosis not present

## 2021-08-21 DIAGNOSIS — E7800 Pure hypercholesterolemia, unspecified: Secondary | ICD-10-CM | POA: Diagnosis not present

## 2021-08-21 DIAGNOSIS — K909 Intestinal malabsorption, unspecified: Secondary | ICD-10-CM | POA: Diagnosis not present

## 2021-08-21 DIAGNOSIS — F411 Generalized anxiety disorder: Secondary | ICD-10-CM | POA: Diagnosis not present

## 2021-08-21 DIAGNOSIS — Z79899 Other long term (current) drug therapy: Secondary | ICD-10-CM | POA: Diagnosis not present

## 2021-08-21 DIAGNOSIS — G250 Essential tremor: Secondary | ICD-10-CM | POA: Diagnosis not present

## 2021-08-21 DIAGNOSIS — K9089 Other intestinal malabsorption: Secondary | ICD-10-CM | POA: Diagnosis not present

## 2021-08-21 DIAGNOSIS — K219 Gastro-esophageal reflux disease without esophagitis: Secondary | ICD-10-CM | POA: Diagnosis not present

## 2021-08-21 DIAGNOSIS — Z0000 Encounter for general adult medical examination without abnormal findings: Secondary | ICD-10-CM | POA: Diagnosis not present

## 2021-08-28 DIAGNOSIS — Z1159 Encounter for screening for other viral diseases: Secondary | ICD-10-CM | POA: Diagnosis not present

## 2021-08-28 DIAGNOSIS — Z20828 Contact with and (suspected) exposure to other viral communicable diseases: Secondary | ICD-10-CM | POA: Diagnosis not present

## 2021-08-30 DIAGNOSIS — Z1159 Encounter for screening for other viral diseases: Secondary | ICD-10-CM | POA: Diagnosis not present

## 2021-08-30 DIAGNOSIS — Z20828 Contact with and (suspected) exposure to other viral communicable diseases: Secondary | ICD-10-CM | POA: Diagnosis not present

## 2021-09-01 DIAGNOSIS — Z1159 Encounter for screening for other viral diseases: Secondary | ICD-10-CM | POA: Diagnosis not present

## 2021-09-01 DIAGNOSIS — Z20828 Contact with and (suspected) exposure to other viral communicable diseases: Secondary | ICD-10-CM | POA: Diagnosis not present

## 2021-09-18 DIAGNOSIS — Z20822 Contact with and (suspected) exposure to covid-19: Secondary | ICD-10-CM | POA: Diagnosis not present

## 2021-09-25 DIAGNOSIS — Z20828 Contact with and (suspected) exposure to other viral communicable diseases: Secondary | ICD-10-CM | POA: Diagnosis not present

## 2021-10-02 DIAGNOSIS — Z20828 Contact with and (suspected) exposure to other viral communicable diseases: Secondary | ICD-10-CM | POA: Diagnosis not present

## 2021-10-16 DIAGNOSIS — Z20828 Contact with and (suspected) exposure to other viral communicable diseases: Secondary | ICD-10-CM | POA: Diagnosis not present

## 2021-12-18 DIAGNOSIS — Z20822 Contact with and (suspected) exposure to covid-19: Secondary | ICD-10-CM | POA: Diagnosis not present

## 2022-02-21 DIAGNOSIS — G250 Essential tremor: Secondary | ICD-10-CM | POA: Diagnosis not present

## 2022-02-21 DIAGNOSIS — F411 Generalized anxiety disorder: Secondary | ICD-10-CM | POA: Diagnosis not present

## 2022-03-23 DIAGNOSIS — G250 Essential tremor: Secondary | ICD-10-CM | POA: Diagnosis not present

## 2022-03-23 DIAGNOSIS — Z803 Family history of malignant neoplasm of breast: Secondary | ICD-10-CM | POA: Diagnosis not present

## 2022-03-23 DIAGNOSIS — M1990 Unspecified osteoarthritis, unspecified site: Secondary | ICD-10-CM | POA: Diagnosis not present

## 2022-03-23 DIAGNOSIS — K219 Gastro-esophageal reflux disease without esophagitis: Secondary | ICD-10-CM | POA: Diagnosis not present

## 2022-03-23 DIAGNOSIS — E785 Hyperlipidemia, unspecified: Secondary | ICD-10-CM | POA: Diagnosis not present

## 2022-03-23 DIAGNOSIS — R32 Unspecified urinary incontinence: Secondary | ICD-10-CM | POA: Diagnosis not present

## 2022-03-23 DIAGNOSIS — Z604 Social exclusion and rejection: Secondary | ICD-10-CM | POA: Diagnosis not present

## 2022-03-23 DIAGNOSIS — R030 Elevated blood-pressure reading, without diagnosis of hypertension: Secondary | ICD-10-CM | POA: Diagnosis not present

## 2022-03-23 DIAGNOSIS — F411 Generalized anxiety disorder: Secondary | ICD-10-CM | POA: Diagnosis not present

## 2022-05-07 DIAGNOSIS — Z1231 Encounter for screening mammogram for malignant neoplasm of breast: Secondary | ICD-10-CM | POA: Diagnosis not present

## 2022-06-29 ENCOUNTER — Ambulatory Visit: Payer: Medicare PPO | Admitting: Podiatry

## 2022-07-04 DIAGNOSIS — R262 Difficulty in walking, not elsewhere classified: Secondary | ICD-10-CM | POA: Diagnosis not present

## 2022-07-04 DIAGNOSIS — M79671 Pain in right foot: Secondary | ICD-10-CM | POA: Diagnosis not present

## 2022-07-07 DIAGNOSIS — Z23 Encounter for immunization: Secondary | ICD-10-CM | POA: Diagnosis not present

## 2022-07-09 DIAGNOSIS — R262 Difficulty in walking, not elsewhere classified: Secondary | ICD-10-CM | POA: Diagnosis not present

## 2022-07-09 DIAGNOSIS — M79671 Pain in right foot: Secondary | ICD-10-CM | POA: Diagnosis not present

## 2022-07-17 DIAGNOSIS — M79671 Pain in right foot: Secondary | ICD-10-CM | POA: Diagnosis not present

## 2022-07-17 DIAGNOSIS — R262 Difficulty in walking, not elsewhere classified: Secondary | ICD-10-CM | POA: Diagnosis not present

## 2022-07-20 DIAGNOSIS — R262 Difficulty in walking, not elsewhere classified: Secondary | ICD-10-CM | POA: Diagnosis not present

## 2022-07-20 DIAGNOSIS — M79671 Pain in right foot: Secondary | ICD-10-CM | POA: Diagnosis not present

## 2022-07-26 DIAGNOSIS — R609 Edema, unspecified: Secondary | ICD-10-CM | POA: Diagnosis not present

## 2022-07-26 DIAGNOSIS — N3000 Acute cystitis without hematuria: Secondary | ICD-10-CM | POA: Diagnosis not present

## 2022-07-26 DIAGNOSIS — R5382 Chronic fatigue, unspecified: Secondary | ICD-10-CM | POA: Diagnosis not present

## 2022-07-31 DIAGNOSIS — M79671 Pain in right foot: Secondary | ICD-10-CM | POA: Diagnosis not present

## 2022-07-31 DIAGNOSIS — R262 Difficulty in walking, not elsewhere classified: Secondary | ICD-10-CM | POA: Diagnosis not present

## 2023-01-04 DIAGNOSIS — N3941 Urge incontinence: Secondary | ICD-10-CM | POA: Diagnosis not present

## 2023-01-30 DIAGNOSIS — G3184 Mild cognitive impairment, so stated: Secondary | ICD-10-CM | POA: Diagnosis not present

## 2023-01-30 DIAGNOSIS — E669 Obesity, unspecified: Secondary | ICD-10-CM | POA: Diagnosis not present

## 2023-01-30 DIAGNOSIS — J309 Allergic rhinitis, unspecified: Secondary | ICD-10-CM | POA: Diagnosis not present

## 2023-01-30 DIAGNOSIS — K219 Gastro-esophageal reflux disease without esophagitis: Secondary | ICD-10-CM | POA: Diagnosis not present

## 2023-01-30 DIAGNOSIS — I10 Essential (primary) hypertension: Secondary | ICD-10-CM | POA: Diagnosis not present

## 2023-01-30 DIAGNOSIS — R269 Unspecified abnormalities of gait and mobility: Secondary | ICD-10-CM | POA: Diagnosis not present

## 2023-01-30 DIAGNOSIS — M810 Age-related osteoporosis without current pathological fracture: Secondary | ICD-10-CM | POA: Diagnosis not present

## 2023-01-30 DIAGNOSIS — M1990 Unspecified osteoarthritis, unspecified site: Secondary | ICD-10-CM | POA: Diagnosis not present

## 2023-01-30 DIAGNOSIS — E785 Hyperlipidemia, unspecified: Secondary | ICD-10-CM | POA: Diagnosis not present

## 2023-02-13 DIAGNOSIS — H6123 Impacted cerumen, bilateral: Secondary | ICD-10-CM | POA: Diagnosis not present

## 2023-04-08 DIAGNOSIS — G250 Essential tremor: Secondary | ICD-10-CM | POA: Diagnosis not present

## 2023-04-08 DIAGNOSIS — I700 Atherosclerosis of aorta: Secondary | ICD-10-CM | POA: Diagnosis not present

## 2023-04-08 DIAGNOSIS — R0602 Shortness of breath: Secondary | ICD-10-CM | POA: Diagnosis not present

## 2023-04-08 DIAGNOSIS — Z23 Encounter for immunization: Secondary | ICD-10-CM | POA: Diagnosis not present

## 2023-04-08 DIAGNOSIS — N189 Chronic kidney disease, unspecified: Secondary | ICD-10-CM | POA: Diagnosis not present

## 2023-04-08 DIAGNOSIS — F411 Generalized anxiety disorder: Secondary | ICD-10-CM | POA: Diagnosis not present

## 2023-04-08 DIAGNOSIS — E7800 Pure hypercholesterolemia, unspecified: Secondary | ICD-10-CM | POA: Diagnosis not present

## 2023-04-08 DIAGNOSIS — K219 Gastro-esophageal reflux disease without esophagitis: Secondary | ICD-10-CM | POA: Diagnosis not present

## 2023-05-01 DIAGNOSIS — H61303 Acquired stenosis of external ear canal, unspecified, bilateral: Secondary | ICD-10-CM | POA: Diagnosis not present

## 2023-05-01 DIAGNOSIS — H6123 Impacted cerumen, bilateral: Secondary | ICD-10-CM | POA: Diagnosis not present

## 2023-05-01 DIAGNOSIS — H903 Sensorineural hearing loss, bilateral: Secondary | ICD-10-CM | POA: Diagnosis not present

## 2023-06-29 DIAGNOSIS — Z23 Encounter for immunization: Secondary | ICD-10-CM | POA: Diagnosis not present

## 2023-07-05 DIAGNOSIS — N3942 Incontinence without sensory awareness: Secondary | ICD-10-CM | POA: Diagnosis not present

## 2023-07-05 DIAGNOSIS — N3281 Overactive bladder: Secondary | ICD-10-CM | POA: Diagnosis not present

## 2023-07-05 DIAGNOSIS — G309 Alzheimer's disease, unspecified: Secondary | ICD-10-CM | POA: Diagnosis not present

## 2023-07-05 DIAGNOSIS — M6289 Other specified disorders of muscle: Secondary | ICD-10-CM | POA: Diagnosis not present

## 2023-07-05 DIAGNOSIS — N3941 Urge incontinence: Secondary | ICD-10-CM | POA: Diagnosis not present

## 2023-07-05 DIAGNOSIS — F0280 Dementia in other diseases classified elsewhere without behavioral disturbance: Secondary | ICD-10-CM | POA: Diagnosis not present

## 2023-08-30 DIAGNOSIS — G309 Alzheimer's disease, unspecified: Secondary | ICD-10-CM | POA: Diagnosis not present

## 2023-08-30 DIAGNOSIS — F0280 Dementia in other diseases classified elsewhere without behavioral disturbance: Secondary | ICD-10-CM | POA: Diagnosis not present

## 2023-08-30 DIAGNOSIS — N3942 Incontinence without sensory awareness: Secondary | ICD-10-CM | POA: Diagnosis not present

## 2023-08-30 DIAGNOSIS — N3281 Overactive bladder: Secondary | ICD-10-CM | POA: Diagnosis not present

## 2023-08-30 DIAGNOSIS — N3941 Urge incontinence: Secondary | ICD-10-CM | POA: Diagnosis not present

## 2023-10-09 DIAGNOSIS — R296 Repeated falls: Secondary | ICD-10-CM | POA: Diagnosis not present

## 2023-10-09 DIAGNOSIS — R32 Unspecified urinary incontinence: Secondary | ICD-10-CM | POA: Diagnosis not present

## 2023-10-25 DIAGNOSIS — Z1231 Encounter for screening mammogram for malignant neoplasm of breast: Secondary | ICD-10-CM | POA: Diagnosis not present

## 2023-11-26 DIAGNOSIS — R922 Inconclusive mammogram: Secondary | ICD-10-CM | POA: Diagnosis not present

## 2023-11-26 DIAGNOSIS — R92332 Mammographic heterogeneous density, left breast: Secondary | ICD-10-CM | POA: Diagnosis not present

## 2023-12-04 DIAGNOSIS — R2681 Unsteadiness on feet: Secondary | ICD-10-CM | POA: Diagnosis not present

## 2023-12-04 DIAGNOSIS — R296 Repeated falls: Secondary | ICD-10-CM | POA: Diagnosis not present

## 2023-12-04 DIAGNOSIS — R2689 Other abnormalities of gait and mobility: Secondary | ICD-10-CM | POA: Diagnosis not present

## 2023-12-04 DIAGNOSIS — M6281 Muscle weakness (generalized): Secondary | ICD-10-CM | POA: Diagnosis not present

## 2023-12-05 DIAGNOSIS — Z0000 Encounter for general adult medical examination without abnormal findings: Secondary | ICD-10-CM | POA: Diagnosis not present

## 2023-12-05 DIAGNOSIS — N3281 Overactive bladder: Secondary | ICD-10-CM | POA: Diagnosis not present

## 2023-12-05 DIAGNOSIS — M810 Age-related osteoporosis without current pathological fracture: Secondary | ICD-10-CM | POA: Diagnosis not present

## 2023-12-05 DIAGNOSIS — R296 Repeated falls: Secondary | ICD-10-CM | POA: Diagnosis not present

## 2023-12-05 DIAGNOSIS — Z1331 Encounter for screening for depression: Secondary | ICD-10-CM | POA: Diagnosis not present

## 2023-12-05 DIAGNOSIS — H9190 Unspecified hearing loss, unspecified ear: Secondary | ICD-10-CM | POA: Diagnosis not present

## 2023-12-05 DIAGNOSIS — F411 Generalized anxiety disorder: Secondary | ICD-10-CM | POA: Diagnosis not present

## 2023-12-05 DIAGNOSIS — N189 Chronic kidney disease, unspecified: Secondary | ICD-10-CM | POA: Diagnosis not present

## 2023-12-05 DIAGNOSIS — K219 Gastro-esophageal reflux disease without esophagitis: Secondary | ICD-10-CM | POA: Diagnosis not present

## 2023-12-05 DIAGNOSIS — G250 Essential tremor: Secondary | ICD-10-CM | POA: Diagnosis not present

## 2023-12-11 DIAGNOSIS — R2681 Unsteadiness on feet: Secondary | ICD-10-CM | POA: Diagnosis not present

## 2023-12-11 DIAGNOSIS — R296 Repeated falls: Secondary | ICD-10-CM | POA: Diagnosis not present

## 2023-12-11 DIAGNOSIS — R2689 Other abnormalities of gait and mobility: Secondary | ICD-10-CM | POA: Diagnosis not present

## 2023-12-11 DIAGNOSIS — M6281 Muscle weakness (generalized): Secondary | ICD-10-CM | POA: Diagnosis not present

## 2023-12-16 DIAGNOSIS — R296 Repeated falls: Secondary | ICD-10-CM | POA: Diagnosis not present

## 2023-12-16 DIAGNOSIS — R2681 Unsteadiness on feet: Secondary | ICD-10-CM | POA: Diagnosis not present

## 2023-12-16 DIAGNOSIS — R2689 Other abnormalities of gait and mobility: Secondary | ICD-10-CM | POA: Diagnosis not present

## 2023-12-16 DIAGNOSIS — M6281 Muscle weakness (generalized): Secondary | ICD-10-CM | POA: Diagnosis not present

## 2023-12-18 DIAGNOSIS — R296 Repeated falls: Secondary | ICD-10-CM | POA: Diagnosis not present

## 2023-12-18 DIAGNOSIS — M6281 Muscle weakness (generalized): Secondary | ICD-10-CM | POA: Diagnosis not present

## 2023-12-18 DIAGNOSIS — R2681 Unsteadiness on feet: Secondary | ICD-10-CM | POA: Diagnosis not present

## 2023-12-18 DIAGNOSIS — R2689 Other abnormalities of gait and mobility: Secondary | ICD-10-CM | POA: Diagnosis not present

## 2023-12-23 DIAGNOSIS — R2681 Unsteadiness on feet: Secondary | ICD-10-CM | POA: Diagnosis not present

## 2023-12-23 DIAGNOSIS — R2689 Other abnormalities of gait and mobility: Secondary | ICD-10-CM | POA: Diagnosis not present

## 2023-12-23 DIAGNOSIS — M6281 Muscle weakness (generalized): Secondary | ICD-10-CM | POA: Diagnosis not present

## 2023-12-23 DIAGNOSIS — R296 Repeated falls: Secondary | ICD-10-CM | POA: Diagnosis not present

## 2023-12-25 DIAGNOSIS — R296 Repeated falls: Secondary | ICD-10-CM | POA: Diagnosis not present

## 2023-12-25 DIAGNOSIS — R2689 Other abnormalities of gait and mobility: Secondary | ICD-10-CM | POA: Diagnosis not present

## 2023-12-25 DIAGNOSIS — R2681 Unsteadiness on feet: Secondary | ICD-10-CM | POA: Diagnosis not present

## 2023-12-25 DIAGNOSIS — M6281 Muscle weakness (generalized): Secondary | ICD-10-CM | POA: Diagnosis not present

## 2023-12-31 DIAGNOSIS — M6281 Muscle weakness (generalized): Secondary | ICD-10-CM | POA: Diagnosis not present

## 2023-12-31 DIAGNOSIS — R2681 Unsteadiness on feet: Secondary | ICD-10-CM | POA: Diagnosis not present

## 2023-12-31 DIAGNOSIS — R2689 Other abnormalities of gait and mobility: Secondary | ICD-10-CM | POA: Diagnosis not present

## 2023-12-31 DIAGNOSIS — R296 Repeated falls: Secondary | ICD-10-CM | POA: Diagnosis not present

## 2024-01-02 DIAGNOSIS — R296 Repeated falls: Secondary | ICD-10-CM | POA: Diagnosis not present

## 2024-01-02 DIAGNOSIS — R2689 Other abnormalities of gait and mobility: Secondary | ICD-10-CM | POA: Diagnosis not present

## 2024-01-02 DIAGNOSIS — R2681 Unsteadiness on feet: Secondary | ICD-10-CM | POA: Diagnosis not present

## 2024-01-02 DIAGNOSIS — M6281 Muscle weakness (generalized): Secondary | ICD-10-CM | POA: Diagnosis not present

## 2024-01-13 DIAGNOSIS — M6281 Muscle weakness (generalized): Secondary | ICD-10-CM | POA: Diagnosis not present

## 2024-01-13 DIAGNOSIS — R2681 Unsteadiness on feet: Secondary | ICD-10-CM | POA: Diagnosis not present

## 2024-01-13 DIAGNOSIS — R296 Repeated falls: Secondary | ICD-10-CM | POA: Diagnosis not present

## 2024-01-13 DIAGNOSIS — R2689 Other abnormalities of gait and mobility: Secondary | ICD-10-CM | POA: Diagnosis not present

## 2024-01-21 DIAGNOSIS — R2681 Unsteadiness on feet: Secondary | ICD-10-CM | POA: Diagnosis not present

## 2024-01-21 DIAGNOSIS — R296 Repeated falls: Secondary | ICD-10-CM | POA: Diagnosis not present

## 2024-01-21 DIAGNOSIS — R2689 Other abnormalities of gait and mobility: Secondary | ICD-10-CM | POA: Diagnosis not present

## 2024-01-21 DIAGNOSIS — M6281 Muscle weakness (generalized): Secondary | ICD-10-CM | POA: Diagnosis not present

## 2024-01-23 DIAGNOSIS — M6281 Muscle weakness (generalized): Secondary | ICD-10-CM | POA: Diagnosis not present

## 2024-01-23 DIAGNOSIS — R296 Repeated falls: Secondary | ICD-10-CM | POA: Diagnosis not present

## 2024-01-23 DIAGNOSIS — R2689 Other abnormalities of gait and mobility: Secondary | ICD-10-CM | POA: Diagnosis not present

## 2024-01-23 DIAGNOSIS — R2681 Unsteadiness on feet: Secondary | ICD-10-CM | POA: Diagnosis not present

## 2024-01-28 DIAGNOSIS — R2689 Other abnormalities of gait and mobility: Secondary | ICD-10-CM | POA: Diagnosis not present

## 2024-01-28 DIAGNOSIS — M6281 Muscle weakness (generalized): Secondary | ICD-10-CM | POA: Diagnosis not present

## 2024-01-28 DIAGNOSIS — R296 Repeated falls: Secondary | ICD-10-CM | POA: Diagnosis not present

## 2024-01-28 DIAGNOSIS — R2681 Unsteadiness on feet: Secondary | ICD-10-CM | POA: Diagnosis not present

## 2024-02-03 DIAGNOSIS — M6281 Muscle weakness (generalized): Secondary | ICD-10-CM | POA: Diagnosis not present

## 2024-02-03 DIAGNOSIS — R2689 Other abnormalities of gait and mobility: Secondary | ICD-10-CM | POA: Diagnosis not present

## 2024-02-03 DIAGNOSIS — R2681 Unsteadiness on feet: Secondary | ICD-10-CM | POA: Diagnosis not present

## 2024-02-03 DIAGNOSIS — R296 Repeated falls: Secondary | ICD-10-CM | POA: Diagnosis not present

## 2024-02-05 DIAGNOSIS — R2681 Unsteadiness on feet: Secondary | ICD-10-CM | POA: Diagnosis not present

## 2024-02-05 DIAGNOSIS — R296 Repeated falls: Secondary | ICD-10-CM | POA: Diagnosis not present

## 2024-02-06 DIAGNOSIS — R296 Repeated falls: Secondary | ICD-10-CM | POA: Diagnosis not present

## 2024-02-06 DIAGNOSIS — R2681 Unsteadiness on feet: Secondary | ICD-10-CM | POA: Diagnosis not present

## 2024-02-06 DIAGNOSIS — R2689 Other abnormalities of gait and mobility: Secondary | ICD-10-CM | POA: Diagnosis not present

## 2024-02-06 DIAGNOSIS — M6281 Muscle weakness (generalized): Secondary | ICD-10-CM | POA: Diagnosis not present

## 2024-02-11 DIAGNOSIS — R2689 Other abnormalities of gait and mobility: Secondary | ICD-10-CM | POA: Diagnosis not present

## 2024-02-11 DIAGNOSIS — M6281 Muscle weakness (generalized): Secondary | ICD-10-CM | POA: Diagnosis not present

## 2024-02-11 DIAGNOSIS — R2681 Unsteadiness on feet: Secondary | ICD-10-CM | POA: Diagnosis not present

## 2024-02-11 DIAGNOSIS — R296 Repeated falls: Secondary | ICD-10-CM | POA: Diagnosis not present

## 2024-02-12 DIAGNOSIS — R296 Repeated falls: Secondary | ICD-10-CM | POA: Diagnosis not present

## 2024-02-12 DIAGNOSIS — R2681 Unsteadiness on feet: Secondary | ICD-10-CM | POA: Diagnosis not present

## 2024-02-12 DIAGNOSIS — R2689 Other abnormalities of gait and mobility: Secondary | ICD-10-CM | POA: Diagnosis not present

## 2024-02-12 DIAGNOSIS — M6281 Muscle weakness (generalized): Secondary | ICD-10-CM | POA: Diagnosis not present

## 2024-02-13 DIAGNOSIS — R296 Repeated falls: Secondary | ICD-10-CM | POA: Diagnosis not present

## 2024-02-13 DIAGNOSIS — R2681 Unsteadiness on feet: Secondary | ICD-10-CM | POA: Diagnosis not present

## 2024-02-14 DIAGNOSIS — R296 Repeated falls: Secondary | ICD-10-CM | POA: Diagnosis not present

## 2024-02-14 DIAGNOSIS — R2681 Unsteadiness on feet: Secondary | ICD-10-CM | POA: Diagnosis not present

## 2024-02-18 DIAGNOSIS — R296 Repeated falls: Secondary | ICD-10-CM | POA: Diagnosis not present

## 2024-02-18 DIAGNOSIS — R2681 Unsteadiness on feet: Secondary | ICD-10-CM | POA: Diagnosis not present

## 2024-02-19 DIAGNOSIS — R2689 Other abnormalities of gait and mobility: Secondary | ICD-10-CM | POA: Diagnosis not present

## 2024-02-19 DIAGNOSIS — R296 Repeated falls: Secondary | ICD-10-CM | POA: Diagnosis not present

## 2024-02-19 DIAGNOSIS — R2681 Unsteadiness on feet: Secondary | ICD-10-CM | POA: Diagnosis not present

## 2024-02-19 DIAGNOSIS — M6281 Muscle weakness (generalized): Secondary | ICD-10-CM | POA: Diagnosis not present

## 2024-02-20 DIAGNOSIS — R296 Repeated falls: Secondary | ICD-10-CM | POA: Diagnosis not present

## 2024-02-20 DIAGNOSIS — R2681 Unsteadiness on feet: Secondary | ICD-10-CM | POA: Diagnosis not present

## 2024-03-03 DIAGNOSIS — R2681 Unsteadiness on feet: Secondary | ICD-10-CM | POA: Diagnosis not present

## 2024-03-03 DIAGNOSIS — R296 Repeated falls: Secondary | ICD-10-CM | POA: Diagnosis not present

## 2024-03-04 DIAGNOSIS — R296 Repeated falls: Secondary | ICD-10-CM | POA: Diagnosis not present

## 2024-03-04 DIAGNOSIS — R2681 Unsteadiness on feet: Secondary | ICD-10-CM | POA: Diagnosis not present

## 2024-03-06 DIAGNOSIS — M6281 Muscle weakness (generalized): Secondary | ICD-10-CM | POA: Diagnosis not present

## 2024-03-06 DIAGNOSIS — R296 Repeated falls: Secondary | ICD-10-CM | POA: Diagnosis not present

## 2024-03-06 DIAGNOSIS — R2681 Unsteadiness on feet: Secondary | ICD-10-CM | POA: Diagnosis not present

## 2024-03-06 DIAGNOSIS — R2689 Other abnormalities of gait and mobility: Secondary | ICD-10-CM | POA: Diagnosis not present

## 2024-03-10 ENCOUNTER — Emergency Department (HOSPITAL_COMMUNITY)
Admission: EM | Admit: 2024-03-10 | Discharge: 2024-03-11 | Disposition: A | Discharge status: Skilled Nursing Facility | Attending: Emergency Medicine | Admitting: Emergency Medicine

## 2024-03-10 ENCOUNTER — Encounter (HOSPITAL_COMMUNITY): Payer: Self-pay | Admitting: Emergency Medicine

## 2024-03-10 ENCOUNTER — Emergency Department (HOSPITAL_COMMUNITY)

## 2024-03-10 DIAGNOSIS — S42292A Other displaced fracture of upper end of left humerus, initial encounter for closed fracture: Secondary | ICD-10-CM | POA: Diagnosis not present

## 2024-03-10 DIAGNOSIS — S42222A 2-part displaced fracture of surgical neck of left humerus, initial encounter for closed fracture: Secondary | ICD-10-CM | POA: Insufficient documentation

## 2024-03-10 DIAGNOSIS — M79622 Pain in left upper arm: Secondary | ICD-10-CM | POA: Diagnosis present

## 2024-03-10 DIAGNOSIS — Z79899 Other long term (current) drug therapy: Secondary | ICD-10-CM | POA: Diagnosis not present

## 2024-03-10 DIAGNOSIS — M79603 Pain in arm, unspecified: Secondary | ICD-10-CM | POA: Diagnosis not present

## 2024-03-10 DIAGNOSIS — D72829 Elevated white blood cell count, unspecified: Secondary | ICD-10-CM | POA: Insufficient documentation

## 2024-03-10 DIAGNOSIS — W19XXXA Unspecified fall, initial encounter: Secondary | ICD-10-CM | POA: Diagnosis not present

## 2024-03-10 DIAGNOSIS — S42212A Unspecified displaced fracture of surgical neck of left humerus, initial encounter for closed fracture: Secondary | ICD-10-CM | POA: Diagnosis not present

## 2024-03-10 DIAGNOSIS — R42 Dizziness and giddiness: Secondary | ICD-10-CM | POA: Diagnosis not present

## 2024-03-10 DIAGNOSIS — I10 Essential (primary) hypertension: Secondary | ICD-10-CM | POA: Insufficient documentation

## 2024-03-10 DIAGNOSIS — W1839XA Other fall on same level, initial encounter: Secondary | ICD-10-CM | POA: Insufficient documentation

## 2024-03-10 LAB — COMPREHENSIVE METABOLIC PANEL WITH GFR
ALT: 15 U/L (ref 0–44)
AST: 17 U/L (ref 15–41)
Albumin: 3.4 g/dL — ABNORMAL LOW (ref 3.5–5.0)
Alkaline Phosphatase: 60 U/L (ref 38–126)
Anion gap: 9 (ref 5–15)
BUN: 17 mg/dL (ref 8–23)
CO2: 26 mmol/L (ref 22–32)
Calcium: 9.1 mg/dL (ref 8.9–10.3)
Chloride: 104 mmol/L (ref 98–111)
Creatinine, Ser: 0.81 mg/dL (ref 0.44–1.00)
GFR, Estimated: >60 mL/min (ref >60)
Glucose, Bld: 122 mg/dL — ABNORMAL HIGH (ref 70–99)
Potassium: 3.6 mmol/L (ref 3.5–5.1)
Sodium: 139 mmol/L (ref 135–145)
Total Bilirubin: 0.7 mg/dL (ref 0.0–1.2)
Total Protein: 6 g/dL — ABNORMAL LOW (ref 6.5–8.1)

## 2024-03-10 LAB — CBC WITH DIFFERENTIAL/PLATELET
Abs Immature Granulocytes: 0.04 10*3/uL (ref 0.00–0.07)
Basophils Absolute: 0.1 10*3/uL (ref 0.0–0.1)
Basophils Relative: 0 %
Eosinophils Absolute: 0.1 10*3/uL (ref 0.0–0.5)
Eosinophils Relative: 1 %
HCT: 44.9 % (ref 36.0–46.0)
Hemoglobin: 14.2 g/dL (ref 12.0–15.0)
Immature Granulocytes: 0 %
Lymphocytes Relative: 27 %
Lymphs Abs: 3.1 10*3/uL (ref 0.7–4.0)
MCH: 32.3 pg (ref 26.0–34.0)
MCHC: 31.6 g/dL (ref 30.0–36.0)
MCV: 102 fL — ABNORMAL HIGH (ref 80.0–100.0)
Monocytes Absolute: 0.8 10*3/uL (ref 0.1–1.0)
Monocytes Relative: 7 %
Neutro Abs: 7.4 10*3/uL (ref 1.7–7.7)
Neutrophils Relative %: 65 %
Platelets: 213 10*3/uL (ref 150–400)
RBC: 4.4 MIL/uL (ref 3.87–5.11)
RDW: 13.9 % (ref 11.5–15.5)
WBC: 11.5 10*3/uL — ABNORMAL HIGH (ref 4.0–10.5)
nRBC: 0 % (ref 0.0–0.2)

## 2024-03-10 LAB — TROPONIN I (HIGH SENSITIVITY): Troponin I (High Sensitivity): 3 ng/L (ref <18)

## 2024-03-10 MED ORDER — ACETAMINOPHEN 500 MG PO TABS
1000.0000 mg | ORAL_TABLET | Freq: Once | ORAL | Status: AC
  Administered 2024-03-11: 1000 mg via ORAL
  Filled 2024-03-10: qty 2

## 2024-03-10 NOTE — ED Triage Notes (Signed)
 PT from [place] and was coming out of bathroom and went to dresser and fell over. Unsure what caused her to fall. Left elbow pain and left shoulder pain. Tenderness and motor weakness. Pulses intake. Nausea and dizziness. Denies neck or back pain and denies hitting head.

## 2024-03-10 NOTE — ED Provider Notes (Signed)
 [place] [facility] AT [facility] Provider Note   CSN: [id number] Arrival date & time: 03/10/24  2220     Patient presents with: Chloe Wong is a 89 y.o. female.   The history is provided by the patient and medical records.  Fall   89 year old female with history of anemia, depression, GERD, hyperlipidemia, hypertension, presenting to the ED from [facility] after a fall.  Patient reports she was going to the bathroom, missed stepped and tried to catch herself on the dresser but then fell down onto the left arm onto carpeted surface.  She denies any head injury or loss of consciousness.  Denies any dizziness or feelings of syncope prior to fall.  Has pain throughout the left upper arm.  She is right-hand dominant.  She does primarily use her wheelchair but is able to ambulate short distances i.e. to bathroom in such.  She is not on anticoagulation.    Prior to Admission medications   Medication Sig Start Date End Date Taking? Authorizing Provider  citalopram  (CELEXA ) 10 MG tablet Take 10 mg by mouth daily. 05/05/15   [provider]  iron  polysaccharides (NIFEREX) 150 MG capsule Take 1 capsule (150 mg total) by mouth daily. 06/17/15   Regalado, Belkys A, MD  loratadine  (CLARITIN ) 10 MG tablet Take 10 mg by mouth daily.    [provider]  Multiple Vitamin (MULTIVITAMIN WITH MINERALS) TABS tablet Take 1 tablet by mouth 2 (two) times daily.     [provider]  mupirocin  ointment (BACTROBAN ) 2 % Apply 1 application topically 3 (three) times daily. 10/11/19   Mortenson, Ashley, MD  MYRBETRIQ  50 MG TB24 tablet Take 50 mg by mouth daily. 05/05/15   [provider]  omeprazole (PRILOSEC) 20 MG capsule Take 20 mg by mouth daily.    [provider]  pravastatin  (PRAVACHOL ) 10 MG tablet Take 10 mg by mouth daily. 04/15/15   [provider]  primidone (MYSOLINE) 50 MG tablet Take 50 mg by mouth at bedtime. 09/25/19    [provider]    Allergies: Patient has no known allergies.    Review of Systems  Musculoskeletal:  Positive for arthralgias.  All other systems reviewed and are negative.   Updated Vital Signs Pulse 80   Temp 98.2 F (36.8 C) (Oral)   Resp 17   SpO2 96%   Physical Exam Vitals and nursing note reviewed.  Constitutional:      Appearance: She is well-developed.  HENT:     Head: Normocephalic and atraumatic.     Comments: No visible head trauma  Eyes:     Conjunctiva/sclera: Conjunctivae normal.     Pupils: Pupils are equal, round, and reactive to light.    Cardiovascular:     Rate and Rhythm: Normal rate and regular rhythm.     Heart sounds: Normal heart sounds.  Pulmonary:     Effort: Pulmonary effort is normal.     Breath sounds: Normal breath sounds.  Abdominal:     General: Bowel sounds are normal.     Palpations: Abdomen is soft.   Musculoskeletal:        General: Normal range of motion.     Cervical back: Normal range of motion.     Comments: Left arm in makeshift sling, pain with tenderness of left upper arm, ROM not tested, radial pulses intact, wiggling fingers on command Pelvis is stable and non-tender, no leg shortening   Skin:  General: Skin is warm and dry.   Neurological:     Mental Status: She is alert and oriented to person, place, and time.     Comments: AAOx3, able to answer questions and follow commands, limited movement of LUE due to injury but otherwise moving extremities without focal deficit    (all labs ordered are listed, but only abnormal results are displayed) Labs Reviewed  COMPREHENSIVE METABOLIC PANEL WITH GFR - Abnormal; Notable for the following components:      Result Value   Glucose, Bld 122 (*)    Total Protein 6.0 (*)    Albumin 3.4 (*)    All other components within normal limits  CBC WITH DIFFERENTIAL/PLATELET - Abnormal; Notable for the following components:   WBC 11.5 (*)    MCV 102.0 (*)    All other  components within normal limits  TROPONIN I (HIGH SENSITIVITY)    EKG: None  Radiology: DG Shoulder Left Result Date: 03/10/2024 CLINICAL DATA:  Fall EXAM: LEFT SHOULDER - 2+ VIEW; LEFT ELBOW - 2 VIEW; LEFT HUMERUS - 2+ VIEW COMPARISON:  None Available. FINDINGS: Left shoulder: There is an acute fracture through the left humeral neck. There is 1/2 shaft with medial and anterior displacement of the distal fracture fragment. There is no dislocation. Soft tissues are within normal limits. Left humerus: There is a healed fracture of the mid left humerus. No additional fracture or dislocation identified. Soft tissues are within normal limits. Left elbow: Limited evaluation secondary to patient positioning. No gross fracture or dislocation. Soft tissues grossly within normal limits. IMPRESSION: 1. Acute displaced fracture of the left humeral neck. 2. Healed fracture of the mid left humerus. 3. Limited evaluation of the left elbow secondary to patient positioning. No gross fracture or dislocation. If there is high clinical concern for acute fracture of the elbow, recommend repeat views. Electronically Signed   By: Greig Pique M.D.   On: 03/10/2024 23:29   DG Humerus Left Result Date: 03/10/2024 CLINICAL DATA:  Fall EXAM: LEFT SHOULDER - 2+ VIEW; LEFT ELBOW - 2 VIEW; LEFT HUMERUS - 2+ VIEW COMPARISON:  None Available. FINDINGS: Left shoulder: There is an acute fracture through the left humeral neck. There is 1/2 shaft with medial and anterior displacement of the distal fracture fragment. There is no dislocation. Soft tissues are within normal limits. Left humerus: There is a healed fracture of the mid left humerus. No additional fracture or dislocation identified. Soft tissues are within normal limits. Left elbow: Limited evaluation secondary to patient positioning. No gross fracture or dislocation. Soft tissues grossly within normal limits. IMPRESSION: 1. Acute displaced fracture of the left humeral neck. 2.  Healed fracture of the mid left humerus. 3. Limited evaluation of the left elbow secondary to patient positioning. No gross fracture or dislocation. If there is high clinical concern for acute fracture of the elbow, recommend repeat views. Electronically Signed   By: Greig Pique M.D.   On: 03/10/2024 23:29   DG Elbow 2 Views Left Result Date: 03/10/2024 CLINICAL DATA:  Fall EXAM: LEFT SHOULDER - 2+ VIEW; LEFT ELBOW - 2 VIEW; LEFT HUMERUS - 2+ VIEW COMPARISON:  None Available. FINDINGS: Left shoulder: There is an acute fracture through the left humeral neck. There is 1/2 shaft with medial and anterior displacement of the distal fracture fragment. There is no dislocation. Soft tissues are within normal limits. Left humerus: There is a healed fracture of the mid left humerus. No additional fracture or dislocation identified. Soft tissues  are within normal limits. Left elbow: Limited evaluation secondary to patient positioning. No gross fracture or dislocation. Soft tissues grossly within normal limits. IMPRESSION: 1. Acute displaced fracture of the left humeral neck. 2. Healed fracture of the mid left humerus. 3. Limited evaluation of the left elbow secondary to patient positioning. No gross fracture or dislocation. If there is high clinical concern for acute fracture of the elbow, recommend repeat views. Electronically Signed   By: Greig Pique M.D.   On: 03/10/2024 23:29     Procedures   Medications Ordered in the ED  acetaminophen  (TYLENOL ) tablet 1,000 mg (1,000 mg Oral Given 03/11/24 0009)  HYDROcodone -acetaminophen  (NORCO/VICODIN) 5-325 MG per tablet 1 tablet (1 tablet Oral Given 03/11/24 0105)                                    Medical Decision Making Amount and/or Complexity of Data Reviewed Labs: ordered. Radiology: ordered and independent interpretation performed. ECG/medicine tests: ordered and independent interpretation performed.  Risk OTC drugs. Prescription drug  management.   89 y.o. F here after what sounds to be mechanical fall while trying to go to the bathroom.  Fell onto left arm on carpeted surface. No head injury or LOC.  Not currently on anticoagulation.  She is AAOx3 here.  Limited movement of LUE due to injury but otherwise moving extremities well.  Pelvis stable, non-tender, no leg shortening.  Left arm in makeshift sling, tender along humerus.  Radial pulse intact, wiggling fingers on command.  Labs and x-rays ordered.  Labs as above--mild leukocytosis but no significant electrolyte derangement.  Troponin is negative.  X-rays with humeral neck fracture.  Correlates clinically with exam findings.  Arm remains NVI.  Will place in arm sling and refer to orthopedics for follow-up.  Plan to discharge back to facility with short supply pain medication.  Can return here for any new acute changes.  Results and care plan discussed with patient and family at the bedside, all voiced understanding.  All questions answered.  Final diagnoses:  Closed 2-part displaced fracture of surgical neck of left humerus, initial encounter    ED Discharge Orders          Ordered    HYDROcodone -acetaminophen  (NORCO/VICODIN) 5-325 MG tablet  Every 4 hours PRN        03/11/24 0030               Jarold Olam HERO, PA-C 03/11/24 0121    Bari Charmaine FALCON, MD 03/13/24 [phone]

## 2024-03-11 MED ORDER — HYDROCODONE-ACETAMINOPHEN 5-325 MG PO TABS: 1.0000 | ORAL_TABLET | ORAL | 0 refills | Status: AC | PRN

## 2024-03-11 MED ORDER — HYDROCODONE-ACETAMINOPHEN 5-325 MG PO TABS
1.0000 | ORAL_TABLET | Freq: Once | ORAL | Status: AC
  Administered 2024-03-11: 1 via ORAL
  Filled 2024-03-11: qty 1

## 2024-03-11 NOTE — Discharge Instructions (Addendum)
 Your were found to have a fracture of your humerus today.  This is generally not a surgical issue but will need to follow-up with orthopedics to ensure proper healing. Wear arm sling for now, avoid moving arm around too much. Can take pain medication-- use caution, can make you a  little sleepy/drowsy. Dr. Jerri is on call-- I would call his office in the morning to arrange a follow-up appt. Can return here for new concerns.

## 2024-03-13 ENCOUNTER — Ambulatory Visit (INDEPENDENT_AMBULATORY_CARE_PROVIDER_SITE_OTHER): Admitting: Physician Assistant

## 2024-03-13 DIAGNOSIS — S42292A Other displaced fracture of upper end of left humerus, initial encounter for closed fracture: Secondary | ICD-10-CM | POA: Diagnosis not present

## 2024-03-13 MED ORDER — TRAMADOL HCL 50 MG PO TABS: 50.0000 mg | ORAL_TABLET | Freq: Three times a day (TID) | ORAL | 2 refills | Status: DC | PRN

## 2024-03-13 NOTE — Progress Notes (Signed)
 Office Visit Note   Patient: Chloe Wong           Date of Birth: Jan 25, 1931           MRN: [id number] Visit Date: 03/13/2024              Requested by: Elliot Charm, MD [address] [place],  [place] [address] PCP: Elliot Charm, MD   Assessment & Plan: Visit Diagnoses:  1. Other closed displaced fracture of proximal end of left humerus, initial encounter     Plan: Impression is 3 days status post displaced left proximal humerus fracture.  Based on the patient's age and activity level, we will have the patient wear her sling at all times for the next 3 weeks.  She will return in 3 weeks for repeat evaluation and Grashey and scapular Y x-rays of the left shoulder.  Call with concerns or questions.  Follow-Up Instructions: Return in about 3 weeks (around 04/03/2024).   Orders:  No orders of the defined types were placed in this encounter.  Meds ordered this encounter  Medications   traMADol (ULTRAM) 50 MG tablet    Sig: Take 1 tablet (50 mg total) by mouth 3 (three) times daily as needed.    Dispense:  30 tablet    Refill:  2      Procedures: No procedures performed   Clinical Data: No additional findings.   Subjective: Chief Complaint  Patient presents with   Left Arm - Injury    HPI patient is a pleasant 89 year old female who comes in today for evaluation treatment recommendation of left proximal humerus fracture.  Patient sustained a fall landing on her left side on 03/10/2024.  She was seen in the ED where x-rays were obtained.  X-rays demonstrated a displaced left proximal humerus fracture.  She was placed in a sling.  She is here today for further evaluation treatment recommendation.  Of note, she is in independent living at [facility].  She uses a walker at baseline but has been using a wheelchair as she is unable to use her walker due to the recent injury.  Review of Systems as detailed in HPI.  All other reviewed and are  negative.   Objective: Vital Signs: There were no vitals taken for this visit.  Physical Exam well-developed well-nourished female in no acute distress.  Alert and oriented x 3.  Ortho Exam left shoulder exam: Moderate tenderness and ecchymosis around the fracture site.  Fingers warm well-perfused.  She is neurovascularly intact distally.  Specialty Comments:  No specialty comments available.  Imaging: No new imaging   PMFS History: Patient Active Problem List   Diagnosis Date Noted   Sepsis (HCC) 06/12/2015   Anemia 06/12/2015   Sepsis secondary to UTI (HCC)    Symptomatic anemia 05/17/2015   Incontinence in female 05/17/2015   HLD (hyperlipidemia) 05/17/2015   GERD without esophagitis 05/17/2015   Depression 05/17/2015   Lower urinary tract infectious disease 10/14/2013   Acute encephalopathy 10/14/2013   HTN (hypertension) 10/14/2013   Encephalopathy acute 10/14/2013   Past Medical History:  Diagnosis Date   Anemia    Arthritis    right leg   Depression    GERD (gastroesophageal reflux disease)    Hyperlipidemia    Urine, incontinence, stress female     Family History  Problem Relation Age of Onset   Heart disease Father    Cancer Mother     Past Surgical History:  Procedure Laterality Date   bladder lift  9 yrs ago   CHOLECYSTECTOMY  20 yrs ago   COLONOSCOPY WITH PROPOFOL  N/A 07/07/2013   Procedure: COLONOSCOPY WITH PROPOFOL ;  Surgeon: Gladis MARLA Louder, MD;  Location: [facility];  Service: Endoscopy;  Laterality: N/A;   ESOPHAGOGASTRODUODENOSCOPY (EGD) WITH PROPOFOL  N/A 07/07/2013   Procedure: ESOPHAGOGASTRODUODENOSCOPY (EGD) WITH PROPOFOL ;  Surgeon: Gladis MARLA Louder, MD;  Location: [facility];  Service: Endoscopy;  Laterality: N/A;   Social History   Occupational History   Not on file  Tobacco Use   Smoking status: Never   Smokeless tobacco: Never  Substance and Sexual Activity   Alcohol use: No   Drug use: No   Sexual activity: Never

## 2024-03-22 ENCOUNTER — Encounter (HOSPITAL_COMMUNITY): Payer: Self-pay | Admitting: *Deleted

## 2024-03-22 ENCOUNTER — Emergency Department (HOSPITAL_COMMUNITY)

## 2024-03-22 ENCOUNTER — Emergency Department (HOSPITAL_COMMUNITY)
Admission: EM | Admit: 2024-03-22 | Discharge: 2024-03-23 | Disposition: A | Discharge status: Home / Self Care | Attending: Emergency Medicine | Admitting: Emergency Medicine

## 2024-03-22 ENCOUNTER — Other Ambulatory Visit: Payer: Self-pay

## 2024-03-22 DIAGNOSIS — N390 Urinary tract infection, site not specified: Secondary | ICD-10-CM | POA: Diagnosis not present

## 2024-03-22 DIAGNOSIS — R4182 Altered mental status, unspecified: Secondary | ICD-10-CM | POA: Diagnosis not present

## 2024-03-22 DIAGNOSIS — R9431 Abnormal electrocardiogram [ECG] [EKG]: Secondary | ICD-10-CM | POA: Diagnosis not present

## 2024-03-22 DIAGNOSIS — R531 Weakness: Secondary | ICD-10-CM | POA: Diagnosis not present

## 2024-03-22 DIAGNOSIS — M84422A Pathological fracture, left humerus, initial encounter for fracture: Secondary | ICD-10-CM | POA: Diagnosis not present

## 2024-03-22 DIAGNOSIS — D72829 Elevated white blood cell count, unspecified: Secondary | ICD-10-CM | POA: Diagnosis not present

## 2024-03-22 DIAGNOSIS — K449 Diaphragmatic hernia without obstruction or gangrene: Secondary | ICD-10-CM | POA: Diagnosis not present

## 2024-03-22 DIAGNOSIS — R410 Disorientation, unspecified: Secondary | ICD-10-CM | POA: Diagnosis not present

## 2024-03-22 DIAGNOSIS — R9082 White matter disease, unspecified: Secondary | ICD-10-CM | POA: Diagnosis not present

## 2024-03-22 LAB — COMPREHENSIVE METABOLIC PANEL WITH GFR
ALT: 17 U/L (ref 0–44)
AST: 18 U/L (ref 15–41)
Albumin: 3.3 g/dL — ABNORMAL LOW (ref 3.5–5.0)
Alkaline Phosphatase: 62 U/L (ref 38–126)
Anion gap: 9 (ref 5–15)
BUN: 16 mg/dL (ref 8–23)
CO2: 28 mmol/L (ref 22–32)
Calcium: 9.1 mg/dL (ref 8.9–10.3)
Chloride: 99 mmol/L (ref 98–111)
Creatinine, Ser: 0.77 mg/dL (ref 0.44–1.00)
GFR, Estimated: >60 mL/min (ref >60)
Glucose, Bld: 113 mg/dL — ABNORMAL HIGH (ref 70–99)
Potassium: 4 mmol/L (ref 3.5–5.1)
Sodium: 136 mmol/L (ref 135–145)
Total Bilirubin: 0.9 mg/dL (ref 0.0–1.2)
Total Protein: 6.3 g/dL — ABNORMAL LOW (ref 6.5–8.1)

## 2024-03-22 LAB — CBC
HCT: 38.4 % (ref 36.0–46.0)
Hemoglobin: 12.4 g/dL (ref 12.0–15.0)
MCH: 32.6 pg (ref 26.0–34.0)
MCHC: 32.3 g/dL (ref 30.0–36.0)
MCV: 101.1 fL — ABNORMAL HIGH (ref 80.0–100.0)
Platelets: 342 K/uL (ref 150–400)
RBC: 3.8 MIL/uL — ABNORMAL LOW (ref 3.87–5.11)
RDW: 14 % (ref 11.5–15.5)
WBC: 11.1 K/uL — ABNORMAL HIGH (ref 4.0–10.5)
nRBC: 0 % (ref 0.0–0.2)

## 2024-03-22 LAB — URINALYSIS, ROUTINE W REFLEX MICROSCOPIC
Bilirubin Urine: NEGATIVE
Glucose, UA: NEGATIVE mg/dL
Hgb urine dipstick: NEGATIVE
Ketones, ur: 5 mg/dL — AB
Nitrite: NEGATIVE
Protein, ur: NEGATIVE mg/dL
Specific Gravity, Urine: 1.017 (ref 1.005–1.030)
WBC, UA: >50 WBC/hpf (ref 0–5)
pH: 5 (ref 5.0–8.0)

## 2024-03-22 MED ORDER — TRAMADOL HCL 50 MG PO TABS
50.0000 mg | ORAL_TABLET | Freq: Once | ORAL | Status: AC
  Administered 2024-03-22: 50 mg via ORAL
  Filled 2024-03-22: qty 1

## 2024-03-22 MED ORDER — CEPHALEXIN 250 MG PO CAPS
250.0000 mg | ORAL_CAPSULE | Freq: Once | ORAL | Status: AC
  Administered 2024-03-22: 250 mg via ORAL
  Filled 2024-03-22: qty 1

## 2024-03-22 MED ORDER — CEPHALEXIN 250 MG PO CAPS: 250.0000 mg | ORAL_CAPSULE | Freq: Four times a day (QID) | ORAL | 0 refills | Status: AC

## 2024-03-22 NOTE — ED Provider Notes (Signed)
 [place] EMERGENCY DEPARTMENT AT [facility] Provider Note   CSN: [id number] Arrival date & time: 03/22/24  1619     Patient presents with: Altered Mental Status   Chloe Wong is a 89 y.o. female.   89 year old female presenting from assisted living facility with altered mental status.  She is accompanied by family member and a caregiver who provide collateral information.  The patient's family was contacted by staff at the assisted living facility this morning and was told that the patient was acting confused, they did not give any specific example of what she was doing that was different from her baseline.  Since she has been with her family member/caregiver today they do feel that she is in her normal state of health, they describe her as sharp and that she has not been demonstrating confusion since they have been with her today.  She does have history of UTIs and they wonder if this may be contributing to her confusion this morning.  Patient has no complaints, denies chest pain, shortness of breath, cough, dysuria, diarrhea, abdominal pain, nausea/vomiting, fever, recent falls.  Patient was recently seen for a fractured left humerus, sling in place.   Altered Mental Status      Prior to Admission medications   Medication Sig Start Date End Date Taking? Authorizing Provider  citalopram  (CELEXA ) 10 MG tablet Take 10 mg by mouth daily. 05/05/15   [provider]  HYDROcodone -acetaminophen  (NORCO/VICODIN) 5-325 MG tablet Take 1 tablet by mouth every 4 (four) hours as needed. 03/11/24   Jarold Olam HERO, PA-C  iron  polysaccharides (NIFEREX) 150 MG capsule Take 1 capsule (150 mg total) by mouth daily. 06/17/15   Regalado, Belkys A, MD  loratadine  (CLARITIN ) 10 MG tablet Take 10 mg by mouth daily.    [provider]  Multiple Vitamin (MULTIVITAMIN WITH MINERALS) TABS tablet Take 1 tablet by mouth 2 (two) times daily.     [provider]  mupirocin   ointment (BACTROBAN ) 2 % Apply 1 application topically 3 (three) times daily. 10/11/19   Mortenson, Ashley, MD  MYRBETRIQ  50 MG TB24 tablet Take 50 mg by mouth daily. 05/05/15   [provider]  omeprazole (PRILOSEC) 20 MG capsule Take 20 mg by mouth daily.    [provider]  pravastatin  (PRAVACHOL ) 10 MG tablet Take 10 mg by mouth daily. 04/15/15   [provider]  primidone (MYSOLINE) 50 MG tablet Take 50 mg by mouth at bedtime. 09/25/19   [provider]  traMADol  (ULTRAM ) 50 MG tablet Take 1 tablet (50 mg total) by mouth 3 (three) times daily as needed. 03/13/24   Jule Ronal CROME, PA-C    Allergies: Patient has no known allergies.    Review of Systems  Updated Vital Signs  Vitals:   03/22/24 1655 03/22/24 1950 03/22/24 2000 03/22/24 2030  BP:  (!) 161/81 128/66 132/75  Pulse: 90 92 91 89  Resp:  18 17 13   Temp:      TempSrc:      SpO2: 94% 97% 95% 94%  Weight:         Physical Exam Vitals and nursing note reviewed.  HENT:     Head: Normocephalic.  Eyes:     Extraocular Movements: Extraocular movements intact.     Pupils: Pupils are equal, round, and reactive to light.  Cardiovascular:     Rate and Rhythm: Normal rate and regular rhythm.  Pulmonary:     Effort: Pulmonary effort is normal.  Breath sounds: Normal breath sounds.  Abdominal:     Palpations: Abdomen is soft.     Tenderness: There is no abdominal tenderness. There is no right CVA tenderness, left CVA tenderness or guarding.  Musculoskeletal:     Cervical back: Normal range of motion.     Comments: L arm in sling  Skin:    General: Skin is warm and dry.  Neurological:     Mental Status: She is alert.     Comments: Oriented to self/date of birth, city, month and day of the week but unable to state name of hospital or correctly identify what year it is Facial movements are symmetric without evidence of facial droop     (all labs ordered are listed, but only abnormal  results are displayed) Labs Reviewed  CBC - Abnormal; Notable for the following components:      Result Value   WBC 11.1 (*)    RBC 3.80 (*)    MCV 101.1 (*)    All other components within normal limits  COMPREHENSIVE METABOLIC PANEL WITH GFR - Abnormal; Notable for the following components:   Glucose, Bld 113 (*)    Total Protein 6.3 (*)    Albumin 3.3 (*)    All other components within normal limits  URINALYSIS, ROUTINE W REFLEX MICROSCOPIC - Abnormal; Notable for the following components:   Color, Urine AMBER (*)    APPearance CLOUDY (*)    Ketones, ur 5 (*)    Leukocytes,Ua MODERATE (*)    Bacteria, UA MANY (*)    All other components within normal limits  URINE CULTURE    EKG: None  Radiology: No results found.   Procedures   Medications Ordered in the ED  cephALEXin  (KEFLEX ) capsule 250 mg (250 mg Oral Given 03/22/24 2126)  traMADol  (ULTRAM ) tablet 50 mg (50 mg Oral Given 03/22/24 2204)                                    Medical Decision Making This patient presents to the ED for concern of intermittent confusion, this involves an extensive number of treatment options, and is a complaint that carries with it a high risk of complications and morbidity.  The differential diagnosis includes urinary tract infection, pneumonia, skin/soft tissue infection,   Co morbidities that complicate the patient evaluation  Hyperlipidemia, GERD, recent humeral fracture   Additional history obtained:  Additional history obtained from record review External records from outside source obtained and reviewed including prior ED note   Lab Tests:  I Ordered, and personally interpreted labs.  The pertinent results include: CBC notable for mild leukocytosis of 11.1, however this was similarly elevated 12 days ago.  CMP unremarkable, stable as compared to previous.  Urinalysis notable for cloudy urine with few ketones, moderate leukocytes with many bacteria.  Nitrite negative, will  send for urine culture.   Imaging Studies ordered:  I ordered imaging studies including CT head, CXR  I independently visualized and interpreted imaging which showed  - CT head: 1. No acute intracranial findings. 2. Atrophy and white matter microvascular disease. - CXR: 1. No acute cardiopulmonary process. 2. Large hiatal hernia. 3. Chronic fracture of the LEFT humerus  I agree with the radiologist interpretation   Cardiac Monitoring: / EKG:  The patient was maintained on a cardiac monitor.  I personally viewed and interpreted the cardiac monitored which showed an underlying rhythm of:  NSR   Problem List / ED Course / Critical interventions / Medication management   I ordered medication including Keflex  for URI, tramadol  for pain Reevaluation of the patient after these medicines showed that the patient improved I have reviewed the patients home medicines and have made adjustments as needed   Test / Admission - Considered:  Physical exam is largely unremarkable as above.  She is hemodynamically stable, afebrile, no tachycardia.  Workup is reassuring as above, urinalysis was notable for leukocytes/bacteria, will send for culture. First dose of Keflex  given in the emergency department this evening, will prescribe 5-day course of Keflex  4 times daily for UTI.  I suspect that patient's earlier confusion was transient and related to urinary tract infection, neurological exam has otherwise been largely reassuring, patient's family member at the bedside states that she is in her normal state of health and is not acting confused at this time.  I do not feel that she would benefit from additional lab/imaging at this time, she is well-appearing and I feel that she would do best to return to her assisted living facility given these reassuring findings.  Strict return precautions discussed, patient and her family member voiced understanding and are in agreement with this plan.  She plans to follow-up  with her provider at the assisted living facility.  Staffed with Dr. Mannie    Amount and/or Complexity of Data Reviewed Labs: ordered. Radiology: ordered.  Risk Prescription drug management.        Final diagnoses:  Urinary tract infection without hematuria, site unspecified    ED Discharge Orders          Ordered    cephALEXin  (KEFLEX ) 250 MG capsule  4 times daily        03/22/24 2056               Glendia Rocky SAILOR, [place] 03/22/24 2305    Mannie Pac T, DO 03/25/24 [phone]

## 2024-03-22 NOTE — ED Triage Notes (Signed)
 BIB [facility] from home. Lives in independent living at [place]. Here with DIL and personal caregiver. EMS called for a confusion episode this am, that has lingered throughout the day. A&Ox3, usually oriented x4. Family thinks she has a UTI as evidenced by confusion, but pt denies pain, dysuria, nausea or any sx. No complaints. Incidentally, pt broke her L arm on 6/24, and has had difficulty wiping well when using the b/r. Pt generally weak. VSS. CBG 142.

## 2024-03-22 NOTE — Discharge Instructions (Addendum)
 Your urine sample shows that you have a urinary tract infection, you were given the first dose of your antibiotic in the emergency department this evening.  Start Keflex , 1 tablet by mouth 4 times daily for 5 days.  Follow-up with your primary care provider.  Return to the emergency department if your symptoms worsen.

## 2024-03-23 DIAGNOSIS — R404 Transient alteration of awareness: Secondary | ICD-10-CM | POA: Diagnosis not present

## 2024-03-23 DIAGNOSIS — Z7401 Bed confinement status: Secondary | ICD-10-CM | POA: Diagnosis not present

## 2024-03-23 DIAGNOSIS — R531 Weakness: Secondary | ICD-10-CM | POA: Diagnosis not present

## 2024-03-25 DIAGNOSIS — R2681 Unsteadiness on feet: Secondary | ICD-10-CM | POA: Diagnosis not present

## 2024-03-25 DIAGNOSIS — R296 Repeated falls: Secondary | ICD-10-CM | POA: Diagnosis not present

## 2024-03-25 LAB — URINE CULTURE: Culture: >=100000 — AB

## 2024-03-26 ENCOUNTER — Telehealth (HOSPITAL_BASED_OUTPATIENT_CLINIC_OR_DEPARTMENT_OTHER): Payer: Self-pay | Admitting: *Deleted

## 2024-03-26 DIAGNOSIS — R2681 Unsteadiness on feet: Secondary | ICD-10-CM | POA: Diagnosis not present

## 2024-03-26 DIAGNOSIS — M6281 Muscle weakness (generalized): Secondary | ICD-10-CM | POA: Diagnosis not present

## 2024-03-26 DIAGNOSIS — R296 Repeated falls: Secondary | ICD-10-CM | POA: Diagnosis not present

## 2024-03-26 DIAGNOSIS — R2689 Other abnormalities of gait and mobility: Secondary | ICD-10-CM | POA: Diagnosis not present

## 2024-03-26 NOTE — Telephone Encounter (Signed)
 Post ED Visit - Positive Culture Follow-up  Culture report reviewed by antimicrobial stewardship pharmacist: [facility] []  Rankin Dee, Pharm.D. []  Venetia Gully, Pharm.D., BCPS AQ-ID []  Garrel Crews, Pharm.D., BCPS [x]  Almarie Lunger, Pharm.D., BCPS []  [place], [address].D., BCPS, AAHIVP []  Rosaline Bihari, Pharm.D., BCPS, AAHIVP []  Vernell Meier, PharmD, BCPS []  Latanya Hint, PharmD, BCPS []  Donald Medley, PharmD, BCPS []  Rocky Bold, PharmD []  Dorothyann Alert, PharmD, BCPS []  Morene Babe, PharmD  [facility] []  Rosaline Edison, PharmD []  Romona Bliss, PharmD []  Dolphus Roller, PharmD []  Veva Seip, Rph []  Vernell Daunt) Leonce, PharmD []  Eva Allis, PharmD []  Rosaline Millet, PharmD []  Iantha Batch, PharmD []  Arvin Gauss, PharmD []  Wanda Hasting, PharmD []  Ronal Rav, PharmD []  Rocky Slade, PharmD []  Bard Jeans, PharmD   Positive urine culture Treated with cephalexin , organism sensitive to the same and no further patient follow-up is required at this time.  Lorita Barnie Pereyra 03/26/2024, 9:18 AM

## 2024-03-27 DIAGNOSIS — R2681 Unsteadiness on feet: Secondary | ICD-10-CM | POA: Diagnosis not present

## 2024-03-27 DIAGNOSIS — R296 Repeated falls: Secondary | ICD-10-CM | POA: Diagnosis not present

## 2024-03-30 DIAGNOSIS — R296 Repeated falls: Secondary | ICD-10-CM | POA: Diagnosis not present

## 2024-03-30 DIAGNOSIS — R2689 Other abnormalities of gait and mobility: Secondary | ICD-10-CM | POA: Diagnosis not present

## 2024-03-30 DIAGNOSIS — M6281 Muscle weakness (generalized): Secondary | ICD-10-CM | POA: Diagnosis not present

## 2024-03-30 DIAGNOSIS — R2681 Unsteadiness on feet: Secondary | ICD-10-CM | POA: Diagnosis not present

## 2024-03-31 DIAGNOSIS — R296 Repeated falls: Secondary | ICD-10-CM | POA: Diagnosis not present

## 2024-03-31 DIAGNOSIS — R2681 Unsteadiness on feet: Secondary | ICD-10-CM | POA: Diagnosis not present

## 2024-04-01 DIAGNOSIS — R2681 Unsteadiness on feet: Secondary | ICD-10-CM | POA: Diagnosis not present

## 2024-04-01 DIAGNOSIS — R296 Repeated falls: Secondary | ICD-10-CM | POA: Diagnosis not present

## 2024-04-02 DIAGNOSIS — M6281 Muscle weakness (generalized): Secondary | ICD-10-CM | POA: Diagnosis not present

## 2024-04-02 DIAGNOSIS — R296 Repeated falls: Secondary | ICD-10-CM | POA: Diagnosis not present

## 2024-04-02 DIAGNOSIS — R2689 Other abnormalities of gait and mobility: Secondary | ICD-10-CM | POA: Diagnosis not present

## 2024-04-02 DIAGNOSIS — R2681 Unsteadiness on feet: Secondary | ICD-10-CM | POA: Diagnosis not present

## 2024-04-03 DIAGNOSIS — R296 Repeated falls: Secondary | ICD-10-CM | POA: Diagnosis not present

## 2024-04-03 DIAGNOSIS — R2689 Other abnormalities of gait and mobility: Secondary | ICD-10-CM | POA: Diagnosis not present

## 2024-04-03 DIAGNOSIS — R2681 Unsteadiness on feet: Secondary | ICD-10-CM | POA: Diagnosis not present

## 2024-04-03 DIAGNOSIS — M6281 Muscle weakness (generalized): Secondary | ICD-10-CM | POA: Diagnosis not present

## 2024-04-06 DIAGNOSIS — R296 Repeated falls: Secondary | ICD-10-CM | POA: Diagnosis not present

## 2024-04-06 DIAGNOSIS — R2681 Unsteadiness on feet: Secondary | ICD-10-CM | POA: Diagnosis not present

## 2024-04-07 ENCOUNTER — Telehealth: Payer: Self-pay | Admitting: Orthopaedic Surgery

## 2024-04-07 ENCOUNTER — Telehealth: Payer: Self-pay

## 2024-04-07 ENCOUNTER — Encounter: Payer: Self-pay | Admitting: Orthopaedic Surgery

## 2024-04-07 ENCOUNTER — Other Ambulatory Visit (INDEPENDENT_AMBULATORY_CARE_PROVIDER_SITE_OTHER): Payer: Self-pay

## 2024-04-07 ENCOUNTER — Ambulatory Visit (INDEPENDENT_AMBULATORY_CARE_PROVIDER_SITE_OTHER): Admitting: Orthopaedic Surgery

## 2024-04-07 DIAGNOSIS — S42292A Other displaced fracture of upper end of left humerus, initial encounter for closed fracture: Secondary | ICD-10-CM

## 2024-04-07 MED ORDER — HYDROCODONE-ACETAMINOPHEN 5-325 MG PO TABS: 0.5000 | ORAL_TABLET | Freq: Four times a day (QID) | ORAL | 0 refills | Status: AC | PRN

## 2024-04-07 MED ORDER — HYDROCODONE-ACETAMINOPHEN 5-325 MG PO TABS: 0.5000 | ORAL_TABLET | Freq: Four times a day (QID) | ORAL | 0 refills | Status: DC | PRN

## 2024-04-07 NOTE — Telephone Encounter (Signed)
 Letter has been faxed to number provided.

## 2024-04-07 NOTE — Telephone Encounter (Signed)
 done

## 2024-04-07 NOTE — Telephone Encounter (Signed)
 sure

## 2024-04-07 NOTE — Telephone Encounter (Signed)
 Just for the pain medication correct?

## 2024-04-07 NOTE — Telephone Encounter (Signed)
 Patient daughter called said if Dr. Jerri  can fax over something in writing to [facility] giving them permission to administer the medication. CB#[phone] Fax# to the home is [phone] Attention [facility]. Also the instruction for the medication.

## 2024-04-07 NOTE — Telephone Encounter (Signed)
 Please resend Hydrocodone  to [facility] pharmacy.

## 2024-04-07 NOTE — Progress Notes (Signed)
 Office Visit Note   Patient: Chloe Wong           Date of Birth: 07/02/1931           MRN: [id number] Visit Date: 04/07/2024              Requested by: Elliot Charm, MD [address] [place],  [place] [address] PCP: Elliot Charm, MD   Assessment & Plan: Visit Diagnoses:  1. Other closed displaced fracture of proximal end of left humerus, initial encounter     Plan: History of Present Illness Chloe Wong is a 89 year old female who follows up today for left proximal humerus fracture.    She experiences significant shoulder pain three weeks post-fracture, with no improvement despite rest and immobilization. Tramadol  is ineffective for pain relief. Initially, she used hydrocodone  for four to five days post-injury before switching to tramadol . She has a previous arm fracture, which may be relevant to her current condition.  Assessment and Plan Displaced proximal humerus fracture Fracture displacement increased, humeral head misaligned with shaft. Conservative management failed. Risk of nonunion and chronic pain due to motion at fracture site. Current tramadol  regimen inadequate. - Refer to Dr. Janit for evaluation and potential reverse shoulder replacement surgery. - Consider resuming hydrocodone  for pain management, possibly at reduced dose by halving the pill, and supplement with ibuprofen  three hours post-hydrocodone .  Follow-Up Instructions: No follow-ups on file.   Orders:  Orders Placed This Encounter  Procedures   XR Shoulder Left   Meds ordered this encounter  Medications   DISCONTD: HYDROcodone -acetaminophen  (NORCO/VICODIN) 5-325 MG tablet    Sig: Take 0.5 tablets by mouth every 6 (six) hours as needed for moderate pain (pain score 4-6).    Dispense:  20 tablet    Refill:  0   HYDROcodone -acetaminophen  (NORCO/VICODIN) 5-325 MG tablet    Sig: Take 0.5 tablets by mouth every 6 (six) hours as needed for moderate pain (pain score  4-6).    Dispense:  20 tablet    Refill:  0      Procedures: No procedures performed   Clinical Data: No additional findings.   Subjective: Chief Complaint  Patient presents with   Left Shoulder - Follow-up    Proximal humerus fracture    HPI  Review of Systems   Objective: Vital Signs: There were no vitals taken for this visit.  Physical Exam  Ortho Exam  Specialty Comments:  No specialty comments available.  Imaging: XR Shoulder Left Result Date: 04/07/2024 Xrays of the left shoulder show interval displacement of proximal humerus surgical neck fracture.  Head splitting fracture of humeral head.    PMFS History: Patient Active Problem List   Diagnosis Date Noted   Sepsis (HCC) 06/12/2015   Anemia 06/12/2015   Sepsis secondary to UTI [facility])    Symptomatic anemia 05/17/2015   Incontinence in female 05/17/2015   HLD (hyperlipidemia) 05/17/2015   GERD without esophagitis 05/17/2015   Depression 05/17/2015   Lower urinary tract infectious disease 10/14/2013   Acute encephalopathy 10/14/2013   HTN (hypertension) 10/14/2013   Encephalopathy acute 10/14/2013   Past Medical History:  Diagnosis Date   Anemia    Arthritis    right leg   Depression    GERD (gastroesophageal reflux disease)    Hyperlipidemia    Urine, incontinence, stress female     Family History  Problem Relation Age of Onset   Heart disease Father    Cancer Mother  Past Surgical History:  Procedure Laterality Date   bladder lift  9 yrs ago   CHOLECYSTECTOMY  20 yrs ago   COLONOSCOPY WITH PROPOFOL  N/A 07/07/2013   Procedure: COLONOSCOPY WITH PROPOFOL ;  Surgeon: Gladis MARLA Louder, MD;  Location: [facility];  Service: Endoscopy;  Laterality: N/A;   ESOPHAGOGASTRODUODENOSCOPY (EGD) WITH PROPOFOL  N/A 07/07/2013   Procedure: ESOPHAGOGASTRODUODENOSCOPY (EGD) WITH PROPOFOL ;  Surgeon: Gladis MARLA Louder, MD;  Location: [facility];  Service: Endoscopy;  Laterality: N/A;   Social  History   Occupational History   Not on file  Tobacco Use   Smoking status: Never   Smokeless tobacco: Never  Substance and Sexual Activity   Alcohol use: No   Drug use: No   Sexual activity: Never

## 2024-04-08 ENCOUNTER — Other Ambulatory Visit (HOSPITAL_BASED_OUTPATIENT_CLINIC_OR_DEPARTMENT_OTHER): Payer: Self-pay

## 2024-04-08 ENCOUNTER — Ambulatory Visit (HOSPITAL_BASED_OUTPATIENT_CLINIC_OR_DEPARTMENT_OTHER): Admitting: Orthopaedic Surgery

## 2024-04-08 DIAGNOSIS — S42292A Other displaced fracture of upper end of left humerus, initial encounter for closed fracture: Secondary | ICD-10-CM | POA: Diagnosis not present

## 2024-04-08 MED ORDER — TRAMADOL HCL 50 MG PO TABS
50.0000 mg | ORAL_TABLET | ORAL | 2 refills | Status: AC | PRN
  Filled 2024-04-08: qty 30, 5d supply, fill #0

## 2024-04-08 NOTE — Progress Notes (Signed)
 Chief Complaint: Left shoulder displaced proximal femur fracture     History of Present Illness:    Chloe Wong is a 89 y.o. female presents 3 weeks status post proximal humerus fracture with significant displacement of the humeral head shaft.  She is here today as a referral from Dr. Jerri for discussion of possible operative intervention.  She is overall somewhat low demand and this is her nondominant arm.  She has been in sling immobilized since the injury.  She is here today with her daughter.  She lives in an assisted living facility   PMH/PSH/Family History/Social History/Meds/Allergies:    Past Medical History:  Diagnosis Date   Anemia    Arthritis    right leg   Depression    GERD (gastroesophageal reflux disease)    Hyperlipidemia    Urine, incontinence, stress female    Past Surgical History:  Procedure Laterality Date   bladder lift  9 yrs ago   CHOLECYSTECTOMY  20 yrs ago   COLONOSCOPY WITH PROPOFOL  N/A 07/07/2013   Procedure: COLONOSCOPY WITH PROPOFOL ;  Surgeon: Gladis MARLA Louder, MD;  Location: [facility];  Service: Endoscopy;  Laterality: N/A;   ESOPHAGOGASTRODUODENOSCOPY (EGD) WITH PROPOFOL  N/A 07/07/2013   Procedure: ESOPHAGOGASTRODUODENOSCOPY (EGD) WITH PROPOFOL ;  Surgeon: Gladis MARLA Louder, MD;  Location: [facility];  Service: Endoscopy;  Laterality: N/A;   Social History   Socioeconomic History   Marital status: Widowed    Spouse name: Not on file   Number of children: Not on file   Years of education: Not on file   Highest education level: Not on file  Occupational History   Not on file  Tobacco Use   Smoking status: Never   Smokeless tobacco: Never  Substance and Sexual Activity   Alcohol use: No   Drug use: No   Sexual activity: Never  Other Topics Concern   Not on file  Social History Narrative   Not on file   Social Drivers of Health   Financial Resource Strain: Not on file  Food Insecurity: Not on file  Transportation Needs:  Not on file  Physical Activity: Not on file  Stress: Not on file  Social Connections: Not on file   Family History  Problem Relation Age of Onset   Heart disease Father    Cancer Mother    No Known Allergies Current Outpatient Medications  Medication Sig Dispense Refill   citalopram  (CELEXA ) 10 MG tablet Take 10 mg by mouth daily.     HYDROcodone -acetaminophen  (NORCO/VICODIN) 5-325 MG tablet Take 1 tablet by mouth every 4 (four) hours as needed. 12 tablet 0   HYDROcodone -acetaminophen  (NORCO/VICODIN) 5-325 MG tablet Take 0.5 tablets by mouth every 6 (six) hours as needed for moderate pain (pain score 4-6). 20 tablet 0   iron  polysaccharides (NIFEREX) 150 MG capsule Take 1 capsule (150 mg total) by mouth daily. 30 capsule 3   loratadine  (CLARITIN ) 10 MG tablet Take 10 mg by mouth daily.     Multiple Vitamin (MULTIVITAMIN WITH MINERALS) TABS tablet Take 1 tablet by mouth 2 (two) times daily.      mupirocin  ointment (BACTROBAN ) 2 % Apply 1 application topically 3 (three) times daily. 22 g 0   MYRBETRIQ  50 MG TB24 tablet Take 50 mg by mouth daily.     omeprazole (PRILOSEC) 20 MG capsule Take 20 mg by mouth daily.     pravastatin  (PRAVACHOL ) 10 MG tablet Take 10 mg by mouth daily.     primidone (MYSOLINE)  50 MG tablet Take 50 mg by mouth at bedtime.     traMADol  (ULTRAM ) 50 MG tablet Take 1 tablet (50 mg total) by mouth 3 (three) times daily as needed. 30 tablet 2   traMADol  (ULTRAM ) 50 MG tablet Take 1 tablet (50 mg total) by mouth every 4 (four) hours as needed for pain. 30 tablet 2   No current facility-administered medications for this visit.   XR Shoulder Left Result Date: 04/07/2024 Xrays of the left shoulder show interval displacement of proximal humerus surgical neck fracture.  Head splitting fracture of humeral head.   Review of Systems:   A ROS was performed including pertinent positives and negatives as documented in the HPI.  Physical Exam :   Constitutional: NAD and  appears stated age Neurological: Alert and oriented Psych: Appropriate affect and cooperative There were no vitals taken for this visit.   Comprehensive Musculoskeletal Exam:    Left shoulder does appear to move his unit when taken out of the sling and passively.  Distal neurosensory exam is intact 2+ radial pulse   Imaging:   Xray (3 views left shoulder): Humeral neck fracture with significant displacement     I personally reviewed and interpreted the radiographs.   Assessment and Plan:   89 y.o. female with significant displacement of the humeral neck.  I did discuss that overall there does appear to be some callus forming as she does move her shoulders unit.  I had a long discussion with her and her daughter and ultimately I do believe treating her pain would be the first step in getting her comfortable.  I did discuss that given the fact that it has been 3 weeks I do not believe there would be significant downside waiting additional week and obtaining an x-ray to assess callus formation.  Overall I do believe that she could still have an okay outcome without surgical treatment.  I did discuss that reverse for fracture would involve somewhat of a more complex procedure which would not be guaranteed to have a good outcome to this effect there would not be significant benefit to performing this now versus an additional several weeks.  Given this I will plan to see her back in a week to check on her callus formation.   I personally saw and evaluated the patient, and participated in the management and treatment plan.  Elspeth Parker, MD Attending Physician, Orthopedic Surgery  This document was dictated using Dragon voice recognition software. A reasonable attempt at proof reading has been made to minimize errors.

## 2024-04-09 DIAGNOSIS — M6281 Muscle weakness (generalized): Secondary | ICD-10-CM | POA: Diagnosis not present

## 2024-04-09 DIAGNOSIS — R296 Repeated falls: Secondary | ICD-10-CM | POA: Diagnosis not present

## 2024-04-09 DIAGNOSIS — R2681 Unsteadiness on feet: Secondary | ICD-10-CM | POA: Diagnosis not present

## 2024-04-09 DIAGNOSIS — R2689 Other abnormalities of gait and mobility: Secondary | ICD-10-CM | POA: Diagnosis not present

## 2024-04-10 DIAGNOSIS — R296 Repeated falls: Secondary | ICD-10-CM | POA: Diagnosis not present

## 2024-04-10 DIAGNOSIS — R2681 Unsteadiness on feet: Secondary | ICD-10-CM | POA: Diagnosis not present

## 2024-04-13 DIAGNOSIS — R2681 Unsteadiness on feet: Secondary | ICD-10-CM | POA: Diagnosis not present

## 2024-04-13 DIAGNOSIS — R2689 Other abnormalities of gait and mobility: Secondary | ICD-10-CM | POA: Diagnosis not present

## 2024-04-13 DIAGNOSIS — R296 Repeated falls: Secondary | ICD-10-CM | POA: Diagnosis not present

## 2024-04-13 DIAGNOSIS — M6281 Muscle weakness (generalized): Secondary | ICD-10-CM | POA: Diagnosis not present

## 2024-04-14 ENCOUNTER — Encounter (HOSPITAL_BASED_OUTPATIENT_CLINIC_OR_DEPARTMENT_OTHER): Payer: Self-pay | Admitting: Orthopaedic Surgery

## 2024-04-14 DIAGNOSIS — N390 Urinary tract infection, site not specified: Secondary | ICD-10-CM | POA: Diagnosis not present

## 2024-04-14 DIAGNOSIS — Z87440 Personal history of urinary (tract) infections: Secondary | ICD-10-CM | POA: Diagnosis not present

## 2024-04-15 DIAGNOSIS — R2689 Other abnormalities of gait and mobility: Secondary | ICD-10-CM | POA: Diagnosis not present

## 2024-04-15 DIAGNOSIS — R2681 Unsteadiness on feet: Secondary | ICD-10-CM | POA: Diagnosis not present

## 2024-04-15 DIAGNOSIS — R296 Repeated falls: Secondary | ICD-10-CM | POA: Diagnosis not present

## 2024-04-15 DIAGNOSIS — M6281 Muscle weakness (generalized): Secondary | ICD-10-CM | POA: Diagnosis not present

## 2024-04-16 ENCOUNTER — Ambulatory Visit (HOSPITAL_BASED_OUTPATIENT_CLINIC_OR_DEPARTMENT_OTHER)

## 2024-04-16 ENCOUNTER — Ambulatory Visit (HOSPITAL_BASED_OUTPATIENT_CLINIC_OR_DEPARTMENT_OTHER): Admitting: Physician Assistant

## 2024-04-16 ENCOUNTER — Telehealth (HOSPITAL_BASED_OUTPATIENT_CLINIC_OR_DEPARTMENT_OTHER): Payer: Self-pay | Admitting: Orthopaedic Surgery

## 2024-04-16 ENCOUNTER — Encounter (HOSPITAL_BASED_OUTPATIENT_CLINIC_OR_DEPARTMENT_OTHER): Payer: Self-pay | Admitting: Physician Assistant

## 2024-04-16 DIAGNOSIS — S42202A Unspecified fracture of upper end of left humerus, initial encounter for closed fracture: Secondary | ICD-10-CM | POA: Diagnosis not present

## 2024-04-16 DIAGNOSIS — S42292A Other displaced fracture of upper end of left humerus, initial encounter for closed fracture: Secondary | ICD-10-CM

## 2024-04-16 NOTE — Progress Notes (Signed)
 Office Visit Note   Patient: Chloe Wong           Date of Birth: 25-Mar-1931           MRN: [id number] Visit Date: 04/16/2024              Requested by: Elliot Charm, MD [address] [place],  [place] [address] PCP: Elliot Charm, MD   Assessment & Plan: Visit Diagnoses:  1. Other closed displaced fracture of proximal end of left humerus, initial encounter     Plan: Patient presents today from her care facility with her daughter.  She is now a little over 2 weeks status post fracture displacement of the left humeral head.  She was seen and examined by Dr. Genelle.  She does by x-rays show some callus healing.  She is neurologically intact on her left extremity she has no crepitus or pain with range of motion of the shoulder passively.  He spoke with the daughter can still continue to treat this conservatively she is a high risk for surgery.  She understands that she will not achieve full range of motion of the left shoulder however would hopefully with therapy would obtain enough for her activities of daily living.  Will write a prescription for range of motion without strengthening of this left shoulder she can do this at her care facility.  Also renewed her prescription for tramadol  every 4 hours.  Will follow-up with Dr. Genelle in 1 month.  Would repeat x-rays at that time.  All of their questions were answered  Follow-Up Instructions: Return in about 1 month (around 05/17/2024).   Orders:  Orders Placed This Encounter  Procedures   DG Shoulder Left   No orders of the defined types were placed in this encounter.     Procedures: No procedures performed   Clinical Data: No additional findings.   Subjective: Chief Complaint  Patient presents with   Left Shoulder - Pain    HPI pleasant 89 year old woman comes in today with a chief complaint of left shoulder pain.  She is 2 weeks status post humeral head displaced fracture on the left.   Because of her high risk is been treating this conservatively she really is not complaining of much pain.  She does manage well with tramadol   Review of Systems  All other systems reviewed and are negative.    Objective: Vital Signs: There were no vitals taken for this visit.  Physical Exam Constitutional:      Appearance: Normal appearance.  Pulmonary:     Effort: Pulmonary effort is normal.     Breath sounds: Normal breath sounds.  Neurological:     General: No focal deficit present.     Mental Status: She is alert and oriented to person, place, and time.  Psychiatric:        Mood and Affect: Mood normal.        Behavior: Behavior normal.     Ortho Exam Examination of her left shoulder compartments are soft and nontender she has resolving ecchymosis.  Passively can go to 100 degrees of forward elevation and 45 degrees of abduction without really any pain no evidence of dissociation with range of motion.  Distal pulses are intact skin is intact Specialty Comments:  No specialty comments available.  Imaging: No results found.   PMFS History: Patient Active Problem List   Diagnosis Date Noted   Sepsis (HCC) 06/12/2015   Anemia 06/12/2015   Sepsis secondary  to UTI [facility])    Symptomatic anemia 05/17/2015   Incontinence in female 05/17/2015   HLD (hyperlipidemia) 05/17/2015   GERD without esophagitis 05/17/2015   Depression 05/17/2015   Lower urinary tract infectious disease 10/14/2013   Acute encephalopathy 10/14/2013   HTN (hypertension) 10/14/2013   Encephalopathy acute 10/14/2013   Past Medical History:  Diagnosis Date   Anemia    Arthritis    right leg   Depression    GERD (gastroesophageal reflux disease)    Hyperlipidemia    Urine, incontinence, stress female     Family History  Problem Relation Age of Onset   Heart disease Father    Cancer Mother     Past Surgical History:  Procedure Laterality Date   bladder lift  9 yrs ago   CHOLECYSTECTOMY  20  yrs ago   COLONOSCOPY WITH PROPOFOL  N/A 07/07/2013   Procedure: COLONOSCOPY WITH PROPOFOL ;  Surgeon: Gladis MARLA Louder, MD;  Location: [facility];  Service: Endoscopy;  Laterality: N/A;   ESOPHAGOGASTRODUODENOSCOPY (EGD) WITH PROPOFOL  N/A 07/07/2013   Procedure: ESOPHAGOGASTRODUODENOSCOPY (EGD) WITH PROPOFOL ;  Surgeon: Gladis MARLA Louder, MD;  Location: [facility];  Service: Endoscopy;  Laterality: N/A;   Social History   Occupational History   Not on file  Tobacco Use   Smoking status: Never   Smokeless tobacco: Never  Substance and Sexual Activity   Alcohol use: No   Drug use: No   Sexual activity: Never

## 2024-04-16 NOTE — Telephone Encounter (Signed)
 To fax notes to [facility] f: [phone] Attn: Cecille

## 2024-04-17 ENCOUNTER — Ambulatory Visit (HOSPITAL_BASED_OUTPATIENT_CLINIC_OR_DEPARTMENT_OTHER): Admitting: Orthopaedic Surgery

## 2024-04-17 DIAGNOSIS — R2681 Unsteadiness on feet: Secondary | ICD-10-CM | POA: Diagnosis not present

## 2024-04-17 DIAGNOSIS — R296 Repeated falls: Secondary | ICD-10-CM | POA: Diagnosis not present

## 2024-04-20 DIAGNOSIS — R2681 Unsteadiness on feet: Secondary | ICD-10-CM | POA: Diagnosis not present

## 2024-04-20 DIAGNOSIS — R2689 Other abnormalities of gait and mobility: Secondary | ICD-10-CM | POA: Diagnosis not present

## 2024-04-20 DIAGNOSIS — R296 Repeated falls: Secondary | ICD-10-CM | POA: Diagnosis not present

## 2024-04-20 DIAGNOSIS — M6281 Muscle weakness (generalized): Secondary | ICD-10-CM | POA: Diagnosis not present

## 2024-04-21 DIAGNOSIS — R296 Repeated falls: Secondary | ICD-10-CM | POA: Diagnosis not present

## 2024-04-21 DIAGNOSIS — R2681 Unsteadiness on feet: Secondary | ICD-10-CM | POA: Diagnosis not present

## 2024-04-23 DIAGNOSIS — R296 Repeated falls: Secondary | ICD-10-CM | POA: Diagnosis not present

## 2024-04-23 DIAGNOSIS — R2681 Unsteadiness on feet: Secondary | ICD-10-CM | POA: Diagnosis not present

## 2024-04-24 DIAGNOSIS — R296 Repeated falls: Secondary | ICD-10-CM | POA: Diagnosis not present

## 2024-04-24 DIAGNOSIS — M6281 Muscle weakness (generalized): Secondary | ICD-10-CM | POA: Diagnosis not present

## 2024-04-24 DIAGNOSIS — R2689 Other abnormalities of gait and mobility: Secondary | ICD-10-CM | POA: Diagnosis not present

## 2024-04-24 DIAGNOSIS — R2681 Unsteadiness on feet: Secondary | ICD-10-CM | POA: Diagnosis not present

## 2024-04-29 DIAGNOSIS — M6281 Muscle weakness (generalized): Secondary | ICD-10-CM | POA: Diagnosis not present

## 2024-04-29 DIAGNOSIS — R2689 Other abnormalities of gait and mobility: Secondary | ICD-10-CM | POA: Diagnosis not present

## 2024-04-29 DIAGNOSIS — R296 Repeated falls: Secondary | ICD-10-CM | POA: Diagnosis not present

## 2024-04-29 DIAGNOSIS — R2681 Unsteadiness on feet: Secondary | ICD-10-CM | POA: Diagnosis not present

## 2024-04-30 DIAGNOSIS — R2681 Unsteadiness on feet: Secondary | ICD-10-CM | POA: Diagnosis not present

## 2024-04-30 DIAGNOSIS — R296 Repeated falls: Secondary | ICD-10-CM | POA: Diagnosis not present

## 2024-05-01 DIAGNOSIS — M6281 Muscle weakness (generalized): Secondary | ICD-10-CM | POA: Diagnosis not present

## 2024-05-01 DIAGNOSIS — R2689 Other abnormalities of gait and mobility: Secondary | ICD-10-CM | POA: Diagnosis not present

## 2024-05-01 DIAGNOSIS — R296 Repeated falls: Secondary | ICD-10-CM | POA: Diagnosis not present

## 2024-05-01 DIAGNOSIS — R2681 Unsteadiness on feet: Secondary | ICD-10-CM | POA: Diagnosis not present

## 2024-05-06 DIAGNOSIS — R2681 Unsteadiness on feet: Secondary | ICD-10-CM | POA: Diagnosis not present

## 2024-05-06 DIAGNOSIS — M6281 Muscle weakness (generalized): Secondary | ICD-10-CM | POA: Diagnosis not present

## 2024-05-06 DIAGNOSIS — R2689 Other abnormalities of gait and mobility: Secondary | ICD-10-CM | POA: Diagnosis not present

## 2024-05-06 DIAGNOSIS — R296 Repeated falls: Secondary | ICD-10-CM | POA: Diagnosis not present

## 2024-05-07 DIAGNOSIS — R296 Repeated falls: Secondary | ICD-10-CM | POA: Diagnosis not present

## 2024-05-07 DIAGNOSIS — R2681 Unsteadiness on feet: Secondary | ICD-10-CM | POA: Diagnosis not present

## 2024-05-08 DIAGNOSIS — R2689 Other abnormalities of gait and mobility: Secondary | ICD-10-CM | POA: Diagnosis not present

## 2024-05-08 DIAGNOSIS — M6281 Muscle weakness (generalized): Secondary | ICD-10-CM | POA: Diagnosis not present

## 2024-05-08 DIAGNOSIS — R296 Repeated falls: Secondary | ICD-10-CM | POA: Diagnosis not present

## 2024-05-08 DIAGNOSIS — N390 Urinary tract infection, site not specified: Secondary | ICD-10-CM | POA: Diagnosis not present

## 2024-05-08 DIAGNOSIS — R2681 Unsteadiness on feet: Secondary | ICD-10-CM | POA: Diagnosis not present

## 2024-05-11 ENCOUNTER — Other Ambulatory Visit: Payer: Self-pay | Admitting: Physician Assistant

## 2024-05-13 DIAGNOSIS — R2689 Other abnormalities of gait and mobility: Secondary | ICD-10-CM | POA: Diagnosis not present

## 2024-05-13 DIAGNOSIS — R2681 Unsteadiness on feet: Secondary | ICD-10-CM | POA: Diagnosis not present

## 2024-05-13 DIAGNOSIS — R296 Repeated falls: Secondary | ICD-10-CM | POA: Diagnosis not present

## 2024-05-13 DIAGNOSIS — M6281 Muscle weakness (generalized): Secondary | ICD-10-CM | POA: Diagnosis not present

## 2024-05-15 DIAGNOSIS — R2681 Unsteadiness on feet: Secondary | ICD-10-CM | POA: Diagnosis not present

## 2024-05-15 DIAGNOSIS — R2689 Other abnormalities of gait and mobility: Secondary | ICD-10-CM | POA: Diagnosis not present

## 2024-05-15 DIAGNOSIS — R296 Repeated falls: Secondary | ICD-10-CM | POA: Diagnosis not present

## 2024-05-15 DIAGNOSIS — M6281 Muscle weakness (generalized): Secondary | ICD-10-CM | POA: Diagnosis not present

## 2024-05-19 DIAGNOSIS — R2681 Unsteadiness on feet: Secondary | ICD-10-CM | POA: Diagnosis not present

## 2024-05-19 DIAGNOSIS — M6281 Muscle weakness (generalized): Secondary | ICD-10-CM | POA: Diagnosis not present

## 2024-05-19 DIAGNOSIS — R296 Repeated falls: Secondary | ICD-10-CM | POA: Diagnosis not present

## 2024-05-19 DIAGNOSIS — R2689 Other abnormalities of gait and mobility: Secondary | ICD-10-CM | POA: Diagnosis not present

## 2024-05-21 DIAGNOSIS — R296 Repeated falls: Secondary | ICD-10-CM | POA: Diagnosis not present

## 2024-05-21 DIAGNOSIS — R2681 Unsteadiness on feet: Secondary | ICD-10-CM | POA: Diagnosis not present

## 2024-05-22 DIAGNOSIS — M6281 Muscle weakness (generalized): Secondary | ICD-10-CM | POA: Diagnosis not present

## 2024-05-22 DIAGNOSIS — R2681 Unsteadiness on feet: Secondary | ICD-10-CM | POA: Diagnosis not present

## 2024-05-22 DIAGNOSIS — R296 Repeated falls: Secondary | ICD-10-CM | POA: Diagnosis not present

## 2024-05-22 DIAGNOSIS — R2689 Other abnormalities of gait and mobility: Secondary | ICD-10-CM | POA: Diagnosis not present

## 2024-05-25 DIAGNOSIS — R2681 Unsteadiness on feet: Secondary | ICD-10-CM | POA: Diagnosis not present

## 2024-05-25 DIAGNOSIS — R296 Repeated falls: Secondary | ICD-10-CM | POA: Diagnosis not present

## 2024-05-27 ENCOUNTER — Ambulatory Visit (HOSPITAL_BASED_OUTPATIENT_CLINIC_OR_DEPARTMENT_OTHER): Admitting: Orthopaedic Surgery

## 2024-05-27 DIAGNOSIS — R2681 Unsteadiness on feet: Secondary | ICD-10-CM | POA: Diagnosis not present

## 2024-05-27 DIAGNOSIS — R296 Repeated falls: Secondary | ICD-10-CM | POA: Diagnosis not present

## 2024-05-28 DIAGNOSIS — M6281 Muscle weakness (generalized): Secondary | ICD-10-CM | POA: Diagnosis not present

## 2024-05-28 DIAGNOSIS — R296 Repeated falls: Secondary | ICD-10-CM | POA: Diagnosis not present

## 2024-05-28 DIAGNOSIS — R2689 Other abnormalities of gait and mobility: Secondary | ICD-10-CM | POA: Diagnosis not present

## 2024-05-28 DIAGNOSIS — R2681 Unsteadiness on feet: Secondary | ICD-10-CM | POA: Diagnosis not present

## 2024-05-29 DIAGNOSIS — R2681 Unsteadiness on feet: Secondary | ICD-10-CM | POA: Diagnosis not present

## 2024-05-29 DIAGNOSIS — R296 Repeated falls: Secondary | ICD-10-CM | POA: Diagnosis not present

## 2024-06-02 DIAGNOSIS — R296 Repeated falls: Secondary | ICD-10-CM | POA: Diagnosis not present

## 2024-06-02 DIAGNOSIS — R2681 Unsteadiness on feet: Secondary | ICD-10-CM | POA: Diagnosis not present

## 2024-06-03 ENCOUNTER — Ambulatory Visit (HOSPITAL_BASED_OUTPATIENT_CLINIC_OR_DEPARTMENT_OTHER)

## 2024-06-03 ENCOUNTER — Ambulatory Visit (HOSPITAL_BASED_OUTPATIENT_CLINIC_OR_DEPARTMENT_OTHER): Admitting: Orthopaedic Surgery

## 2024-06-03 DIAGNOSIS — M6281 Muscle weakness (generalized): Secondary | ICD-10-CM | POA: Diagnosis not present

## 2024-06-03 DIAGNOSIS — R296 Repeated falls: Secondary | ICD-10-CM | POA: Diagnosis not present

## 2024-06-03 DIAGNOSIS — S42292A Other displaced fracture of upper end of left humerus, initial encounter for closed fracture: Secondary | ICD-10-CM

## 2024-06-03 DIAGNOSIS — M19012 Primary osteoarthritis, left shoulder: Secondary | ICD-10-CM | POA: Diagnosis not present

## 2024-06-03 DIAGNOSIS — R2681 Unsteadiness on feet: Secondary | ICD-10-CM | POA: Diagnosis not present

## 2024-06-03 DIAGNOSIS — R2689 Other abnormalities of gait and mobility: Secondary | ICD-10-CM | POA: Diagnosis not present

## 2024-06-03 DIAGNOSIS — S42202K Unspecified fracture of upper end of left humerus, subsequent encounter for fracture with nonunion: Secondary | ICD-10-CM | POA: Diagnosis not present

## 2024-06-03 NOTE — Progress Notes (Signed)
 Chief Complaint: Left shoulder displaced proximal femur fracture     History of Present Illness:   06/03/2024: Presents today for follow-up of the left shoulder.  Overall range of motion is coming along nicely and her pain is improved dramatically  Alix Lahmann is a 89 y.o. female presents 3 weeks status post proximal humerus fracture with significant displacement of the humeral head shaft.  She is here today as a referral from Dr. Jerri for discussion of possible operative intervention.  She is overall somewhat low demand and this is her nondominant arm.  She has been in sling immobilized since the injury.  She is here today with her daughter.  She lives in an assisted living facility   PMH/PSH/Family History/Social History/Meds/Allergies:    Past Medical History:  Diagnosis Date   Anemia    Arthritis    right leg   Depression    GERD (gastroesophageal reflux disease)    Hyperlipidemia    Urine, incontinence, stress female    Past Surgical History:  Procedure Laterality Date   bladder lift  9 yrs ago   CHOLECYSTECTOMY  20 yrs ago   COLONOSCOPY WITH PROPOFOL  N/A 07/07/2013   Procedure: COLONOSCOPY WITH PROPOFOL ;  Surgeon: Gladis MARLA Louder, MD;  Location: [facility];  Service: Endoscopy;  Laterality: N/A;   ESOPHAGOGASTRODUODENOSCOPY (EGD) WITH PROPOFOL  N/A 07/07/2013   Procedure: ESOPHAGOGASTRODUODENOSCOPY (EGD) WITH PROPOFOL ;  Surgeon: Gladis MARLA Louder, MD;  Location: [facility];  Service: Endoscopy;  Laterality: N/A;   Social History   Socioeconomic History   Marital status: Widowed    Spouse name: Not on file   Number of children: Not on file   Years of education: Not on file   Highest education level: Not on file  Occupational History   Not on file  Tobacco Use   Smoking status: Never   Smokeless tobacco: Never  Substance and Sexual Activity   Alcohol use: No   Drug use: No   Sexual activity: Never  Other Topics Concern   Not on file  Social History  Narrative   Not on file   Social Drivers of Health   Financial Resource Strain: Not on file  Food Insecurity: Not on file  Transportation Needs: Not on file  Physical Activity: Not on file  Stress: Not on file  Social Connections: Not on file   Family History  Problem Relation Age of Onset   Heart disease Father    Cancer Mother    No Known Allergies Current Outpatient Medications  Medication Sig Dispense Refill   citalopram  (CELEXA ) 10 MG tablet Take 10 mg by mouth daily.     GNP PAIN RELIEF EX-STRENGTH 500 MG tablet TAKE ONE TABLET AS NEEDED FOR PAIN EVERY 6 HOURS (TO be given WITH tramadol ) 120 tablet 1   HYDROcodone -acetaminophen  (NORCO/VICODIN) 5-325 MG tablet Take 1 tablet by mouth every 4 (four) hours as needed. 12 tablet 0   HYDROcodone -acetaminophen  (NORCO/VICODIN) 5-325 MG tablet Take 0.5 tablets by mouth every 6 (six) hours as needed for moderate pain (pain score 4-6). 20 tablet 0   iron  polysaccharides (NIFEREX) 150 MG capsule Take 1 capsule (150 mg total) by mouth daily. 30 capsule 3   loratadine  (CLARITIN ) 10 MG tablet Take 10 mg by mouth daily.     Multiple Vitamin (MULTIVITAMIN WITH MINERALS) TABS tablet Take 1 tablet by mouth 2 (two) times daily.      mupirocin  ointment (BACTROBAN ) 2 % Apply 1 application topically 3 (three) times daily. 22  g 0   MYRBETRIQ  50 MG TB24 tablet Take 50 mg by mouth daily.     omeprazole (PRILOSEC) 20 MG capsule Take 20 mg by mouth daily.     pravastatin  (PRAVACHOL ) 10 MG tablet Take 10 mg by mouth daily.     primidone (MYSOLINE) 50 MG tablet Take 50 mg by mouth at bedtime.     traMADol  (ULTRAM ) 50 MG tablet Take 1 tablet (50 mg total) by mouth every 4 (four) hours as needed for pain. 30 tablet 2   traMADol  (ULTRAM ) 50 MG tablet TAKE ONE TABLET BY MOUTH THREE TIMES DAILY AS NEEDED 30 tablet 2   No current facility-administered medications for this visit.   No results found.   Review of Systems:   A ROS was performed including  pertinent positives and negatives as documented in the HPI.  Physical Exam :   Constitutional: NAD and appears stated age Neurological: Alert and oriented Psych: Appropriate affect and cooperative There were no vitals taken for this visit.   Comprehensive Musculoskeletal Exam:    Left shoulder does appear to move his unit when taken out of the sling and passively.  Distal neurosensory exam is intact 2+ radial pulse   Imaging:   Xray (3 views left shoulder): Humeral neck fracture with improved displacement and early callus formation     I personally reviewed and interpreted the radiographs.   Assessment and Plan:   89 y.o. female with significant displacement of the humeral neck.  I did discuss that her x-rays today do show evidence of improvement in her displacement profile.  Overall her pain is much better and she now has approximately 40 to 50 degrees of flexion.  At this time we will continue to advance her active range of motion and strengthening and I will plan to see her back in 3 months for reassessment  I personally saw and evaluated the patient, and participated in the management and treatment plan.  Elspeth Parker, MD Attending Physician, Orthopedic Surgery  This document was dictated using Dragon voice recognition software. A reasonable attempt at proof reading has been made to minimize errors.

## 2024-06-04 DIAGNOSIS — R2681 Unsteadiness on feet: Secondary | ICD-10-CM | POA: Diagnosis not present

## 2024-06-04 DIAGNOSIS — R296 Repeated falls: Secondary | ICD-10-CM | POA: Diagnosis not present

## 2024-06-05 DIAGNOSIS — R2681 Unsteadiness on feet: Secondary | ICD-10-CM | POA: Diagnosis not present

## 2024-06-05 DIAGNOSIS — M6281 Muscle weakness (generalized): Secondary | ICD-10-CM | POA: Diagnosis not present

## 2024-06-05 DIAGNOSIS — R2689 Other abnormalities of gait and mobility: Secondary | ICD-10-CM | POA: Diagnosis not present

## 2024-06-05 DIAGNOSIS — R296 Repeated falls: Secondary | ICD-10-CM | POA: Diagnosis not present

## 2024-06-09 DIAGNOSIS — M6281 Muscle weakness (generalized): Secondary | ICD-10-CM | POA: Diagnosis not present

## 2024-06-09 DIAGNOSIS — R2689 Other abnormalities of gait and mobility: Secondary | ICD-10-CM | POA: Diagnosis not present

## 2024-06-09 DIAGNOSIS — R296 Repeated falls: Secondary | ICD-10-CM | POA: Diagnosis not present

## 2024-06-09 DIAGNOSIS — R2681 Unsteadiness on feet: Secondary | ICD-10-CM | POA: Diagnosis not present

## 2024-06-10 DIAGNOSIS — K5909 Other constipation: Secondary | ICD-10-CM | POA: Diagnosis not present

## 2024-06-10 DIAGNOSIS — M810 Age-related osteoporosis without current pathological fracture: Secondary | ICD-10-CM | POA: Diagnosis not present

## 2024-06-10 DIAGNOSIS — M6281 Muscle weakness (generalized): Secondary | ICD-10-CM | POA: Diagnosis not present

## 2024-06-10 DIAGNOSIS — R2681 Unsteadiness on feet: Secondary | ICD-10-CM | POA: Diagnosis not present

## 2024-06-10 DIAGNOSIS — R296 Repeated falls: Secondary | ICD-10-CM | POA: Diagnosis not present

## 2024-06-10 DIAGNOSIS — K219 Gastro-esophageal reflux disease without esophagitis: Secondary | ICD-10-CM | POA: Diagnosis not present

## 2024-06-10 DIAGNOSIS — Z23 Encounter for immunization: Secondary | ICD-10-CM | POA: Diagnosis not present

## 2024-06-10 DIAGNOSIS — R2689 Other abnormalities of gait and mobility: Secondary | ICD-10-CM | POA: Diagnosis not present

## 2024-06-10 DIAGNOSIS — R634 Abnormal weight loss: Secondary | ICD-10-CM | POA: Diagnosis not present

## 2024-06-12 DIAGNOSIS — R296 Repeated falls: Secondary | ICD-10-CM | POA: Diagnosis not present

## 2024-06-12 DIAGNOSIS — R2681 Unsteadiness on feet: Secondary | ICD-10-CM | POA: Diagnosis not present

## 2024-06-15 DIAGNOSIS — R296 Repeated falls: Secondary | ICD-10-CM | POA: Diagnosis not present

## 2024-06-15 DIAGNOSIS — R2681 Unsteadiness on feet: Secondary | ICD-10-CM | POA: Diagnosis not present

## 2024-06-17 DIAGNOSIS — R296 Repeated falls: Secondary | ICD-10-CM | POA: Diagnosis not present

## 2024-06-17 DIAGNOSIS — R2681 Unsteadiness on feet: Secondary | ICD-10-CM | POA: Diagnosis not present

## 2024-06-18 DIAGNOSIS — R2681 Unsteadiness on feet: Secondary | ICD-10-CM | POA: Diagnosis not present

## 2024-06-18 DIAGNOSIS — R296 Repeated falls: Secondary | ICD-10-CM | POA: Diagnosis not present

## 2024-06-23 DIAGNOSIS — R2681 Unsteadiness on feet: Secondary | ICD-10-CM | POA: Diagnosis not present

## 2024-06-24 DIAGNOSIS — R2681 Unsteadiness on feet: Secondary | ICD-10-CM | POA: Diagnosis not present

## 2024-06-26 DIAGNOSIS — R2681 Unsteadiness on feet: Secondary | ICD-10-CM | POA: Diagnosis not present

## 2024-06-26 DIAGNOSIS — R296 Repeated falls: Secondary | ICD-10-CM | POA: Diagnosis not present

## 2024-06-30 DIAGNOSIS — R2681 Unsteadiness on feet: Secondary | ICD-10-CM | POA: Diagnosis not present

## 2024-06-30 DIAGNOSIS — R296 Repeated falls: Secondary | ICD-10-CM | POA: Diagnosis not present

## 2024-07-01 DIAGNOSIS — R2681 Unsteadiness on feet: Secondary | ICD-10-CM | POA: Diagnosis not present

## 2024-07-01 DIAGNOSIS — R296 Repeated falls: Secondary | ICD-10-CM | POA: Diagnosis not present

## 2024-07-03 DIAGNOSIS — R2681 Unsteadiness on feet: Secondary | ICD-10-CM | POA: Diagnosis not present

## 2024-07-03 DIAGNOSIS — R296 Repeated falls: Secondary | ICD-10-CM | POA: Diagnosis not present

## 2024-07-06 DIAGNOSIS — R2681 Unsteadiness on feet: Secondary | ICD-10-CM | POA: Diagnosis not present

## 2024-07-06 DIAGNOSIS — R296 Repeated falls: Secondary | ICD-10-CM | POA: Diagnosis not present

## 2024-07-13 DIAGNOSIS — R2681 Unsteadiness on feet: Secondary | ICD-10-CM | POA: Diagnosis not present

## 2024-07-13 DIAGNOSIS — R296 Repeated falls: Secondary | ICD-10-CM | POA: Diagnosis not present

## 2024-07-17 DIAGNOSIS — R296 Repeated falls: Secondary | ICD-10-CM | POA: Diagnosis not present

## 2024-07-17 DIAGNOSIS — R2681 Unsteadiness on feet: Secondary | ICD-10-CM | POA: Diagnosis not present

## 2024-07-20 ENCOUNTER — Encounter: Payer: Self-pay | Admitting: Radiology

## 2024-07-21 DIAGNOSIS — R296 Repeated falls: Secondary | ICD-10-CM | POA: Diagnosis not present

## 2024-07-21 DIAGNOSIS — R2681 Unsteadiness on feet: Secondary | ICD-10-CM | POA: Diagnosis not present

## 2024-07-23 DIAGNOSIS — R2681 Unsteadiness on feet: Secondary | ICD-10-CM | POA: Diagnosis not present

## 2024-07-23 DIAGNOSIS — R296 Repeated falls: Secondary | ICD-10-CM | POA: Diagnosis not present

## 2024-07-28 DIAGNOSIS — R296 Repeated falls: Secondary | ICD-10-CM | POA: Diagnosis not present

## 2024-07-28 DIAGNOSIS — R2681 Unsteadiness on feet: Secondary | ICD-10-CM | POA: Diagnosis not present

## 2024-07-29 DIAGNOSIS — R2681 Unsteadiness on feet: Secondary | ICD-10-CM | POA: Diagnosis not present

## 2024-07-29 DIAGNOSIS — R296 Repeated falls: Secondary | ICD-10-CM | POA: Diagnosis not present

## 2024-08-03 DIAGNOSIS — R2681 Unsteadiness on feet: Secondary | ICD-10-CM | POA: Diagnosis not present

## 2024-08-04 DIAGNOSIS — R296 Repeated falls: Secondary | ICD-10-CM | POA: Diagnosis not present

## 2024-08-04 DIAGNOSIS — R2681 Unsteadiness on feet: Secondary | ICD-10-CM | POA: Diagnosis not present

## 2024-08-11 DIAGNOSIS — R296 Repeated falls: Secondary | ICD-10-CM | POA: Diagnosis not present

## 2024-08-11 DIAGNOSIS — R2681 Unsteadiness on feet: Secondary | ICD-10-CM | POA: Diagnosis not present

## 2024-08-12 DIAGNOSIS — R2681 Unsteadiness on feet: Secondary | ICD-10-CM | POA: Diagnosis not present

## 2024-08-12 DIAGNOSIS — R296 Repeated falls: Secondary | ICD-10-CM | POA: Diagnosis not present

## 2024-08-15 ENCOUNTER — Emergency Department (HOSPITAL_COMMUNITY): Admission: EM | Admit: 2024-08-15 | Discharge: 2024-08-16 | Disposition: A

## 2024-08-15 ENCOUNTER — Other Ambulatory Visit: Payer: Self-pay

## 2024-08-15 ENCOUNTER — Emergency Department (HOSPITAL_COMMUNITY)

## 2024-08-15 DIAGNOSIS — S0990XA Unspecified injury of head, initial encounter: Secondary | ICD-10-CM

## 2024-08-15 DIAGNOSIS — S066X0A Traumatic subarachnoid hemorrhage without loss of consciousness, initial encounter: Secondary | ICD-10-CM | POA: Diagnosis not present

## 2024-08-15 DIAGNOSIS — S0003XA Contusion of scalp, initial encounter: Secondary | ICD-10-CM | POA: Diagnosis not present

## 2024-08-15 DIAGNOSIS — S065XAA Traumatic subdural hemorrhage with loss of consciousness status unknown, initial encounter: Secondary | ICD-10-CM

## 2024-08-15 DIAGNOSIS — W19XXXA Unspecified fall, initial encounter: Secondary | ICD-10-CM

## 2024-08-15 DIAGNOSIS — W010XXA Fall on same level from slipping, tripping and stumbling without subsequent striking against object, initial encounter: Secondary | ICD-10-CM | POA: Diagnosis not present

## 2024-08-15 DIAGNOSIS — M4312 Spondylolisthesis, cervical region: Secondary | ICD-10-CM | POA: Diagnosis not present

## 2024-08-15 DIAGNOSIS — M50323 Other cervical disc degeneration at C6-C7 level: Secondary | ICD-10-CM | POA: Diagnosis not present

## 2024-08-15 DIAGNOSIS — M47812 Spondylosis without myelopathy or radiculopathy, cervical region: Secondary | ICD-10-CM | POA: Diagnosis not present

## 2024-08-15 DIAGNOSIS — S0101XA Laceration without foreign body of scalp, initial encounter: Secondary | ICD-10-CM | POA: Insufficient documentation

## 2024-08-15 DIAGNOSIS — S199XXA Unspecified injury of neck, initial encounter: Secondary | ICD-10-CM | POA: Diagnosis not present

## 2024-08-15 DIAGNOSIS — S065X0A Traumatic subdural hemorrhage without loss of consciousness, initial encounter: Secondary | ICD-10-CM | POA: Insufficient documentation

## 2024-08-15 DIAGNOSIS — I10 Essential (primary) hypertension: Secondary | ICD-10-CM | POA: Diagnosis not present

## 2024-08-15 MED ORDER — LIDOCAINE-EPINEPHRINE (PF) 2 %-1:200000 IJ SOLN
20.0000 mL | Freq: Once | INTRAMUSCULAR | Status: AC
  Administered 2024-08-15: 20 mL
  Filled 2024-08-15: qty 20

## 2024-08-15 NOTE — ED Triage Notes (Signed)
 Patient BIB EMS from [facility] c/o mechanical fall. Patient states she back pedal and tripped on a open dresser. Patient fell backwards and hit the back of her head in a cabinet.  Patient sustained laceration on her head. Patient denies LOC. Patient denies taking blood thinners. Bleeding controlled. Patient a/ox4.

## 2024-08-15 NOTE — ED Notes (Signed)
 Provider placed 5 staples in scalp of patient

## 2024-08-15 NOTE — ED Provider Notes (Signed)
 [facility] Emergency Department Provider Note MRN:  [id number]  Arrival date & time: 08/16/24     Chief Complaint   Fall   History of Present Illness   Chloe Wong is a 89 y.o. year-old female presents to the ED with chief complaint of head injury.  She states that there was a drawer that was left open at her home and she hit her head against it causing a small bleeding wound to the back of the head.  She did not fall to the ground.  She denies any pain in her extremities.  She denies headache.  She is not anticoagulated.  She denies numbness, weakness, or tingling.  Denies any other associated symptoms.  Denies any treatment prior to arrival..  History provided by patient.   Review of Systems  Pertinent positive and negative review of systems noted in HPI.    Physical Exam   Vitals:   08/16/24 0345 08/16/24 0400  BP: 126/61   Pulse: 74 76  Resp: 18   Temp:    SpO2: 94% 93%    CONSTITUTIONAL:  non toxic-appearing, NAD NEURO:  Alert and oriented x 3, CN 3-12 grossly intact EYES:  eyes equal and reactive ENT/NECK:  Supple, no stridor  CARDIO:  normal rate, regular rhythm, appears well-perfused  PULM:  No respiratory distress, CTAB GI/GU:  non-distended,  MSK/SPINE:  No gross deformities, no edema, moves all extremities  SKIN:  no rash, atraumatic, 3 cm laceration to posterior scalp   *Additional and/or pertinent findings included in MDM below  Diagnostic and Interventional Summary    EKG Interpretation Date/Time:    Ventricular Rate:    PR Interval:    QRS Duration:    QT Interval:    QTC Calculation:   R Axis:      Text Interpretation:         Labs Reviewed - No data to display  CT HEAD WO CONTRAST ( )  Final Result    CT Head Wo Contrast  Final Result  Addendum (preliminary) 2 of 2  * ADDENDUM #2 *  ADDENDUM:    Addendum to correct transcription therein document critical result  communication.    Impression should  state: Trace subdural hemorrhage instead of   subarachnoid  hemorrhage.    Critical results communicated by telephone on 11 / 29 / 25 at 10:53 pm to   Dr.  Vicky, who verbally acknowledged receipt.    Electronically signed by: Norman Gatlin MD 08/15/2024 10:57 PM EST RP  Workstation: [id number]      Final    CT Cervical Spine Wo Contrast  Final Result      Medications  lidocaine-EPINEPHrine (XYLOCAINE W/EPI) 2 %-1:200000 (PF) injection 20 mL (20 mLs Infiltration Given 08/15/24 2242)     Procedures  /  Critical Care .Critical Care  Performed by: Vicky Charleston, PA-C Authorized by: Vicky Charleston, PA-C   Critical care provider statement:    Critical care time (minutes):  32   Critical care was necessary to treat or prevent imminent or life-threatening deterioration of the following conditions:  CNS failure or compromise   Critical care was time spent personally by me on the following activities:  Development of treatment plan with patient or surrogate, discussions with consultants, evaluation of patient's response to treatment, examination of patient, ordering and review of laboratory studies, ordering and review of radiographic studies, ordering and performing treatments and interventions, pulse oximetry, re-evaluation of patient's condition and review of old charts  ED Course and Medical Decision Making  I have reviewed the triage vital signs, the nursing notes, and pertinent available records from the EMR.  Social Determinants Affecting Complexity of Care: Patient has no clinically significant social determinants affecting this chief complaint..   ED Course:    Medical Decision Making Patient here after bumping her head on a drawer and sustaining a head injury.  She did have bleeding.  There was a small laceration which was repaired with staples after copious irrigation.  CT showed small subdural hematoma.  The scan was repeated at 6 hours to ensure no  worsening.  Repeat CT was stable.  Will discharge patient home with PCP follow-up for staple removal in 7 to 10 days.  Amount and/or Complexity of Data Reviewed Radiology: ordered.  Risk Prescription drug management.         Consultants: I consulted with Luke Pean, APP covering neurosurgery call, who agrees with plan that patient could have repeat CT or discharged.   Treatment and Plan: I considered admission due to patient's initial presentation, but after considering the examination and diagnostic results, patient will not require admission and can be discharged with outpatient follow-up.    Final Clinical Impressions(s) / ED Diagnoses     ICD-10-CM   1. Fall, initial encounter  W19.XXXA     2. Injury of head, initial encounter  S09.90XA     3. Laceration of scalp, initial encounter  S01.01XA     4. Subdural hematoma (HCC)  S06.[facility]       ED Discharge Orders     None         Discharge Instructions Discussed with and Provided to Patient:     Discharge Instructions      The repeat CT scan was stable.  You need to have your staples taken out in about 7 days.  You can follow-up with your primary care doctor.  If you have worsening headache, numbness, weakness, tingling, loss of vision, difficulty speaking, you should dial 911.  Otherwise please follow-up with your doctor in 7 days.       Vicky Charleston, PA-C 08/16/24 [id number]    Trine Raynell Moder, MD 08/16/24 [id number]

## 2024-08-16 ENCOUNTER — Emergency Department (HOSPITAL_COMMUNITY)

## 2024-08-16 DIAGNOSIS — S0990XA Unspecified injury of head, initial encounter: Secondary | ICD-10-CM | POA: Diagnosis not present

## 2024-08-16 DIAGNOSIS — S0003XA Contusion of scalp, initial encounter: Secondary | ICD-10-CM | POA: Diagnosis not present

## 2024-08-16 DIAGNOSIS — I6200 Nontraumatic subdural hemorrhage, unspecified: Secondary | ICD-10-CM | POA: Diagnosis not present

## 2024-08-16 NOTE — Discharge Instructions (Signed)
 The repeat CT scan was stable.  You need to have your staples taken out in about 7 days.  You can follow-up with your primary care doctor.  If you have worsening headache, numbness, weakness, tingling, loss of vision, difficulty speaking, you should dial 911.  Otherwise please follow-up with your doctor in 7 days.

## 2024-08-24 DIAGNOSIS — S065XAA Traumatic subdural hemorrhage with loss of consciousness status unknown, initial encounter: Secondary | ICD-10-CM | POA: Diagnosis not present

## 2024-08-24 DIAGNOSIS — S0191XA Laceration without foreign body of unspecified part of head, initial encounter: Secondary | ICD-10-CM | POA: Diagnosis not present

## 2024-09-02 ENCOUNTER — Ambulatory Visit (HOSPITAL_BASED_OUTPATIENT_CLINIC_OR_DEPARTMENT_OTHER): Admitting: Orthopaedic Surgery
